# Patient Record
Sex: Male | Born: 1990 | Race: White | Hispanic: No | Marital: Single | Smoking: Never smoker
Health system: Southern US, Community
[De-identification: ages and names within clinical notes are randomized; demographics above are authoritative.]

## PROBLEM LIST (undated history)

## (undated) DIAGNOSIS — F32A Depression, unspecified: Secondary | ICD-10-CM

## (undated) DIAGNOSIS — H9311 Tinnitus, right ear: Secondary | ICD-10-CM

## (undated) DIAGNOSIS — F909 Attention-deficit hyperactivity disorder, unspecified type: Secondary | ICD-10-CM

## (undated) DIAGNOSIS — H18609 Keratoconus, unspecified, unspecified eye: Secondary | ICD-10-CM

## (undated) HISTORY — PX: HERNIA REPAIR: SHX51

## (undated) HISTORY — DX: Keratoconus, unspecified, unspecified eye: H18.609

## (undated) HISTORY — DX: Attention-deficit hyperactivity disorder, unspecified type: F90.9

## (undated) HISTORY — PX: WISDOM TOOTH EXTRACTION: SHX21

## (undated) HISTORY — DX: Depression, unspecified: F32.A

## (undated) HISTORY — DX: Tinnitus, right ear: H93.11

---
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
## 2012-11-03 IMAGING — CR DG CHEST 2V
2 series · 2 of 2 positions shown · non-contrast
Comparison: 07/17/2006

CLINICAL DATA: Cough and wheezing for 1-1/2 weeks.

CHEST - 2 VIEW

[view not recorded (1 of 2)]
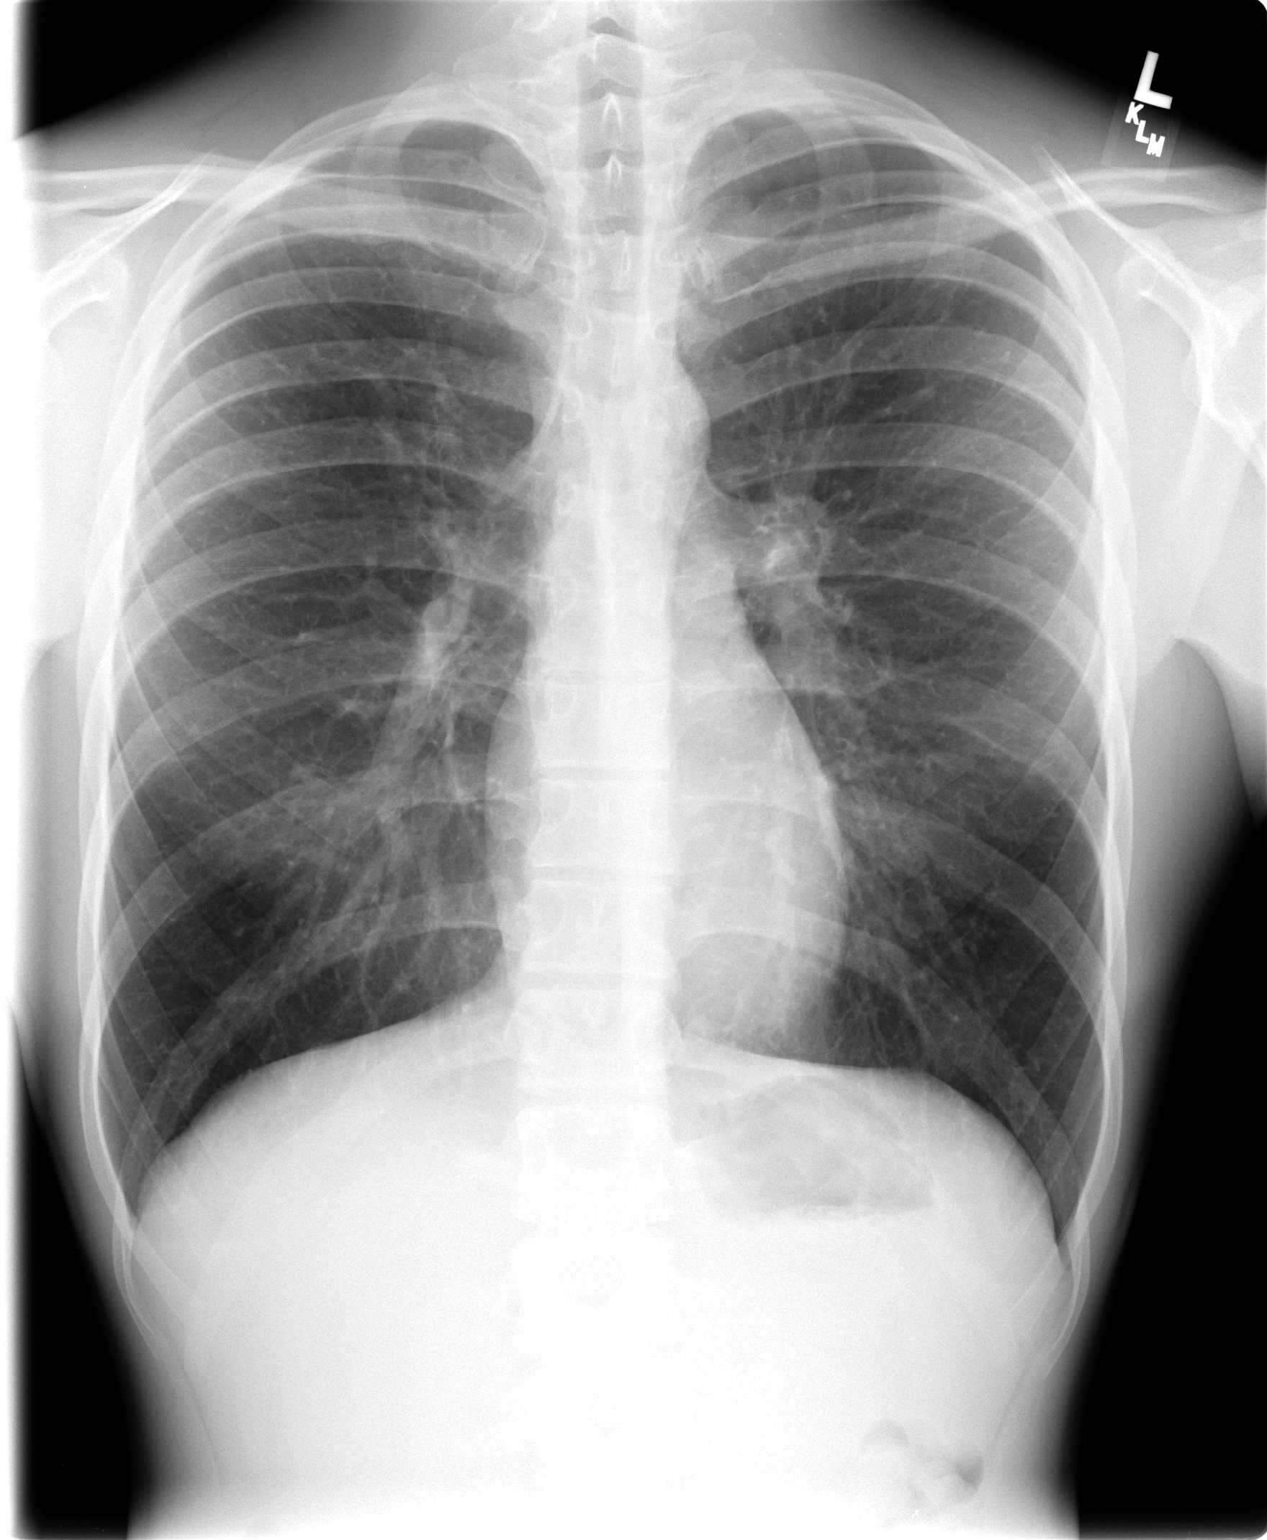

[view not recorded (2 of 2)]
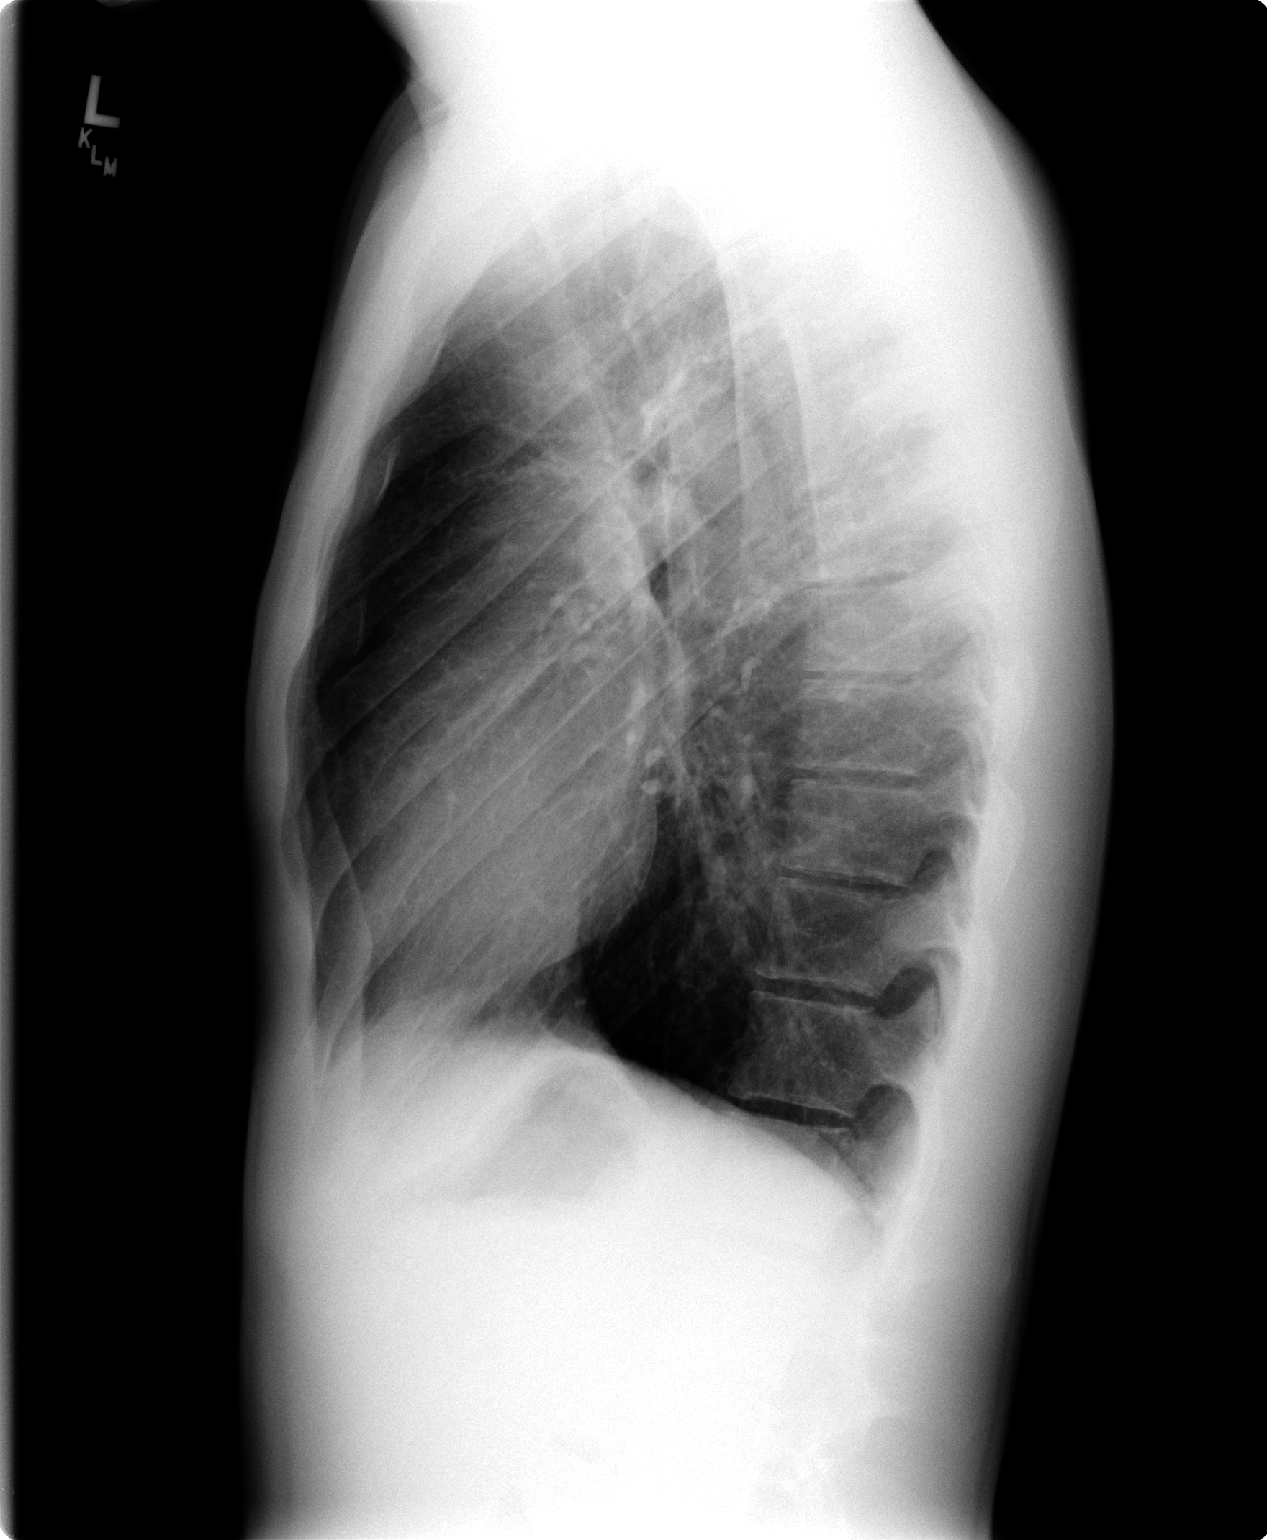

[2 of 2 positions shown; findings below may reference images not displayed]

FINDINGS: The lung fields are well expanded with mild
hyperinflation.  Taking this into consideration heart and
mediastinal contours are within normal limits.  There is mild
central peribronchial cuffing identified and this can be seen with
viral pneumonitis as well as reactive airway disease.  The lung
fields are clear with no signs of focal infiltrate or congestive
failure.  No pleural fluid or peribronchial cuffing is seen.  No
pneumothorax is noted.

Bony structures appear intact.
IMPRESSION: Mild hyperinflation with mild central peribronchial cuffing.  This
finding can be seen with reactive airway disease and viral
pneumonitis.  No findings to suggest focal bronchopneumonia.

## 2014-08-07 ENCOUNTER — Emergency Department (HOSPITAL_COMMUNITY)
Admission: EM | Admit: 2014-08-07 | Discharge: 2014-08-07 | Disposition: A | Discharge status: Home / Self Care | Payer: BC Managed Care – PPO | Attending: Emergency Medicine | Admitting: Emergency Medicine

## 2014-08-07 ENCOUNTER — Encounter (HOSPITAL_COMMUNITY): Payer: Self-pay | Admitting: Emergency Medicine

## 2014-08-07 DIAGNOSIS — R05 Cough: Secondary | ICD-10-CM | POA: Diagnosis present

## 2014-08-07 DIAGNOSIS — J069 Acute upper respiratory infection, unspecified: Secondary | ICD-10-CM | POA: Insufficient documentation

## 2014-08-07 MED ORDER — AZITHROMYCIN 250 MG PO TABS: 250.0000 mg | ORAL_TABLET | Freq: Every day | ORAL | Status: DC

## 2014-08-07 NOTE — ED Notes (Addendum)
Pt reports productive cough over past weeks, can not recall what his sputum has looked like. He is concerned because he had pneumonia in January, he is afraid he may have pneumonia again. He denies pain. States he might have had a low fever

## 2014-08-07 NOTE — Discharge Instructions (Signed)
Upper Respiratory Infection, Adult An upper respiratory infection (URI) is also sometimes known as the common cold. The upper respiratory tract includes the nose, sinuses, throat, trachea, and bronchi. Bronchi are the airways leading to the lungs. Most people improve within 1 week, but symptoms can last up to 2 weeks. A residual cough may last even longer.  CAUSES Many different viruses can infect the tissues lining the upper respiratory tract. The tissues become irritated and inflamed and often become very moist. Mucus production is also common. A cold is contagious. You can easily spread the virus to others by oral contact. This includes kissing, sharing a glass, coughing, or sneezing. Touching your mouth or nose and then touching a surface, which is then touched by another person, can also spread the virus. SYMPTOMS  Symptoms typically develop 1 to 3 days after you come in contact with a cold virus. Symptoms vary from person to person. They may include:  Runny nose.  Sneezing.  Nasal congestion.  Sinus irritation.  Sore throat.  Loss of voice (laryngitis).  Cough.  Fatigue.  Muscle aches.  Loss of appetite.  Headache.  Low-grade fever. DIAGNOSIS  You might diagnose your own cold based on familiar symptoms, since most people get a cold 2 to 3 times a year. Your caregiver can confirm this based on your exam. Most importantly, your caregiver can check that your symptoms are not due to another disease such as strep throat, sinusitis, pneumonia, asthma, or epiglottitis. Blood tests, throat tests, and X-rays are not necessary to diagnose a common cold, but they may sometimes be helpful in excluding other more serious diseases. Your caregiver will decide if any further tests are required. RISKS AND COMPLICATIONS  You may be at risk for a more severe case of the common cold if you smoke cigarettes, have chronic heart disease (such as heart failure) or lung disease (such as asthma), or if  you have a weakened immune system. The very young and very old are also at risk for more serious infections. Bacterial sinusitis, middle ear infections, and bacterial pneumonia can complicate the common cold. The common cold can worsen asthma and chronic obstructive pulmonary disease (COPD). Sometimes, these complications can require emergency medical care and may be life-threatening. PREVENTION  The best way to protect against getting a cold is to practice good hygiene. Avoid oral or hand contact with people with cold symptoms. Wash your hands often if contact occurs. There is no clear evidence that vitamin C, vitamin E, echinacea, or exercise reduces the chance of developing a cold. However, it is always recommended to get plenty of rest and practice good nutrition. TREATMENT  Treatment is directed at relieving symptoms. There is no cure. Antibiotics are not effective, because the infection is caused by a virus, not by bacteria. Treatment may include:  Increased fluid intake. Sports drinks offer valuable electrolytes, sugars, and fluids.  Breathing heated mist or steam (vaporizer or shower).  Eating chicken soup or other clear broths, and maintaining good nutrition.  Getting plenty of rest.  Using gargles or lozenges for comfort.  Controlling fevers with ibuprofen or acetaminophen as directed by your caregiver.  Increasing usage of your inhaler if you have asthma. Zinc gel and zinc lozenges, taken in the first 24 hours of the common cold, can shorten the duration and lessen the severity of symptoms. Pain medicines may help with fever, muscle aches, and throat pain. A variety of non-prescription medicines are available to treat congestion and runny nose. Your caregiver   can make recommendations and may suggest nasal or lung inhalers for other symptoms.  HOME CARE INSTRUCTIONS   Only take over-the-counter or prescription medicines for pain, discomfort, or fever as directed by your  caregiver.  Use a warm mist humidifier or inhale steam from a shower to increase air moisture. This may keep secretions moist and make it easier to breathe.  Drink enough water and fluids to keep your urine clear or pale yellow.  Rest as needed.  Return to work when your temperature has returned to normal or as your caregiver advises. You may need to stay home longer to avoid infecting others. You can also use a face mask and careful hand washing to prevent spread of the virus. SEEK MEDICAL CARE IF:   After the first few days, you feel you are getting worse rather than better.  You need your caregiver's advice about medicines to control symptoms.  You develop chills, worsening shortness of breath, or brown or red sputum. These may be signs of pneumonia.  You develop yellow or brown nasal discharge or pain in the face, especially when you bend forward. These may be signs of sinusitis.  You develop a fever, swollen neck glands, pain with swallowing, or white areas in the back of your throat. These may be signs of strep throat. SEEK IMMEDIATE MEDICAL CARE IF:   You have a fever.  You develop severe or persistent headache, ear pain, sinus pain, or chest pain.  You develop wheezing, a prolonged cough, cough up blood, or have a change in your usual mucus (if you have chronic lung disease).  You develop sore muscles or a stiff neck. Document Released: 04/16/2001 Document Revised: 01/13/2012 Document Reviewed: 01/26/2014 [employer] Patient Information 2015 [employer]. This information is not intended to replace advice given to you by your health care provider. Make sure you discuss any questions you have with your health care provider.  

## 2014-08-07 NOTE — ED Provider Notes (Signed)
CSN: [id number]     Arrival date & time 08/07/14  1554 History  This chart was scribed for non-physician practitioner, Rick [name]. Rick Seat, PA-C working with Rick Bucco, MD by Rick [name], ED scribe. This patient was seen in room TR06C/TR06C and the patient's care was started at 4:19 PM.  Chief Complaint  Patient presents with  . Cough   The history is provided by the patient. No language interpreter was used.   HPI Comments: Rick [name] is a 23 y.o. male who presents to the Emergency Department complaining of a intermittent, gradually improving cough that started 20 days ago when he had a cold. He states wheezing and a fever of 99 as associated symptoms. Pt visited doctor on campus four times since the onset of symptoms. When he visited two weeks ago the doctor prescribed a 3-day medication, an inhaler, and asked him to get a chest x-ray. The pt does not know what the medication was, but he notes that he has had some relief to symptoms. His visit today is mostly regarding the unfilled order for a chest x-ray. Pt states cough is worse at night. Pt denies CP and allergies. Pt is concerned that cough may be stimulated by mold because he lives in old building on campus.  History reviewed. No pertinent past medical history. History reviewed. No pertinent past surgical history. History reviewed. No pertinent family history. History  Substance Use Topics  . Smoking status: Never Smoker   . Smokeless tobacco: Not on file  . Alcohol Use: Yes    Review of Systems  Respiratory: Positive for cough and wheezing.   Cardiovascular: Negative for chest pain.  Allergic/Immunologic: Negative.   All other systems reviewed and are negative.  Allergies  Review of patient's allergies indicates no known allergies.  Home Medications   Prior to Admission medications   Medication Sig Start Date End Date Taking? Authorizing Provider  azithromycin (ZITHROMAX) 250 MG tablet Take 1 tablet (250 mg total)  by mouth daily. Take first 2 tablets together, then 1 every day until finished. 08/07/14   Rick Mallen Irine Seal, PA-C   BP 136/80  Pulse 96  Temp(Src) 98.4 F (36.9 C) (Oral)  Resp 14  Ht 5\' 11"  (1.803 m)  Wt 128 lb (58.06 kg)  BMI 17.86 kg/m2  SpO2 100% Physical Exam  Nursing note and vitals reviewed. Constitutional: He appears well-developed and well-nourished. No distress.  HENT:  Head: Normocephalic and atraumatic.  Eyes: Conjunctivae and EOM are normal.  Neck: Neck supple. No tracheal deviation present.  Cardiovascular: Normal rate, regular rhythm and normal heart sounds.   Pulmonary/Chest: Effort normal and breath sounds normal. No respiratory distress. He has no wheezes. He has no rales.  Skin: Skin is warm and dry.  Psychiatric: He has a normal mood and affect. His behavior is normal.    ED Course  Procedures  DIAGNOSTIC STUDIES: Oxygen Saturation is 96% on RA, adequate by my interpretation.    COORDINATION OF CARE: 4:27 PM Will not order x-ray because symptoms have improved and the patient has already finished treatment. Will order a Z-pack. Discussed treatment plan with pt at bedside and pt agreed to plan. Pt can follow-up with PCP. Pt looks well, non toxic. Feeling better has inhaler and cough medication already.  Labs Review Labs Reviewed - No data to display  Imaging Review No results found.   EKG Interpretation None      MDM   Final diagnoses:  URI (upper respiratory infection)  23 y.o.Rick [name]'s evaluation in the Emergency Department is complete. It has been determined that no acute conditions requiring further emergency intervention are present at this time. The patient/guardian have been advised of the diagnosis and plan. We have discussed signs and symptoms that warrant return to the ED, such as changes or worsening in symptoms.  Vital signs are stable at discharge. Filed Vitals:   08/07/14 1641  BP: 136/80  Pulse: 96  Temp: 98.4 F (36.9  C)  Resp: 14    Patient/guardian has voiced understanding and agreed to follow-up with the PCP or specialist.  I personally performed the services described in this documentation, which was scribed in my presence. The recorded information has been reviewed and is accurate.    Rick Matasiffany G Haillee Johann, PA-C 08/07/14 1644

## 2014-08-07 NOTE — ED Notes (Signed)
Declined W/C at D/C and was escorted to lobby by RN. 

## 2014-08-07 NOTE — ED Provider Notes (Signed)
Medical screening examination/treatment/procedure(s) were performed by non-physician practitioner and as supervising physician I was immediately available for consultation/collaboration.   EKG Interpretation None        Nashly Olsson, MD 08/07/14 2338 

## 2017-03-24 ENCOUNTER — Telehealth: Payer: Self-pay | Admitting: *Deleted

## 2017-03-24 NOTE — Telephone Encounter (Signed)
PreVisit Call attempted. Left VM. 

## 2017-03-25 ENCOUNTER — Ambulatory Visit: Payer: PPO | Admitting: Family Medicine

## 2017-03-25 DIAGNOSIS — Z0289 Encounter for other administrative examinations: Secondary | ICD-10-CM

## 2017-03-26 ENCOUNTER — Encounter: Payer: Self-pay | Admitting: Family Medicine

## 2017-03-26 ENCOUNTER — Encounter (INDEPENDENT_AMBULATORY_CARE_PROVIDER_SITE_OTHER): Payer: Self-pay

## 2017-03-26 ENCOUNTER — Ambulatory Visit (INDEPENDENT_AMBULATORY_CARE_PROVIDER_SITE_OTHER): Payer: Self-pay | Admitting: Family Medicine

## 2017-03-26 VITALS — BP 120/78 | HR 116 | Temp 98.1°F | Wt 136.6 lb

## 2017-03-26 DIAGNOSIS — F909 Attention-deficit hyperactivity disorder, unspecified type: Secondary | ICD-10-CM

## 2017-03-26 NOTE — Progress Notes (Addendum)
  HPI  Review of Systems  Constitutional: Negative for chills, fever and malaise/fatigue.  HENT: Negative for ear pain, sinus pain and sore throat.   Eyes: Negative for blurred vision and double vision.  Respiratory: Negative for cough, shortness of breath and wheezing.   Cardiovascular: Negative for chest pain, palpitations and leg swelling.  Gastrointestinal: Negative for abdominal pain, diarrhea and vomiting.  Musculoskeletal: Negative for back pain, joint pain and neck pain.  Neurological: Negative for dizziness and headaches.  Psychiatric/Behavioral: Negative for depression, hallucinations and memory loss.  Physical Exam  This is a 26 year old patient that was to establish with me today. Unfortunately, I did not know until he presented to the office that he only wanted a prescription of Vyvanse for me. He already has a psychiatrist that prescribes Ritalin. And he received that medication within the last 2 weeks. He also takes Xanax twice daily. His issue is that he will be leaving this weekend to go out of the country and wanted the prescription today. Rather than have him incur costs of the visit, I went ahead and let him and his mother noted that I'm not comfortable with this and don't feel that we would be a good fit regarding physician-patient relationship. No charge for this visit.

## 2021-05-04 DIAGNOSIS — Z0000 Encounter for general adult medical examination without abnormal findings: Secondary | ICD-10-CM | POA: Diagnosis not present

## 2021-05-05 LAB — HEPATIC FUNCTION PANEL
ALT: 24 U/L (ref 10–40)
AST: 19 (ref 14–40)
Alkaline Phosphatase: 55 (ref 25–125)
Bilirubin, Total: 1.1

## 2021-05-05 LAB — BASIC METABOLIC PANEL
BUN: 16 (ref 4–21)
CO2: 27 — AB (ref 13–22)
Chloride: 103 (ref 99–108)
Creatinine: 0.9 (ref 0.6–1.3)
Glucose: 87
Potassium: 3.9 mEq/L (ref 3.5–5.1)
Sodium: 140 (ref 137–147)

## 2021-05-05 LAB — CBC AND DIFFERENTIAL
HCT: 48 (ref 41–53)
Hemoglobin: 16.2 (ref 13.5–17.5)
Neutrophils Absolute: 3315
Platelets: 244 10*3/uL (ref 150–400)
WBC: 6.5

## 2021-05-05 LAB — LIPID PANEL
Cholesterol: 225 — AB (ref 0–200)
HDL: 39 (ref 35–70)
LDL Cholesterol: 156
LDl/HDL Ratio: 5.8
Triglycerides: 162 — AB (ref 40–160)

## 2021-05-05 LAB — VITAMIN D 25 HYDROXY (VIT D DEFICIENCY, FRACTURES): Vit D, 25-Hydroxy: 22

## 2021-05-05 LAB — CBC: RBC: 5.37 — AB (ref 3.87–5.11)

## 2021-05-05 LAB — COMPREHENSIVE METABOLIC PANEL
Albumin: 4.9 (ref 3.5–5.0)
Calcium: 10.1 (ref 8.7–10.7)
Globulin: 2.5

## 2021-05-05 LAB — TSH: TSH: 1.25 (ref 0.41–5.90)

## 2021-06-11 DIAGNOSIS — H18601 Keratoconus, unspecified, right eye: Secondary | ICD-10-CM | POA: Diagnosis not present

## 2021-09-17 DIAGNOSIS — H18601 Keratoconus, unspecified, right eye: Secondary | ICD-10-CM | POA: Diagnosis not present

## 2022-03-26 DIAGNOSIS — H18601 Keratoconus, unspecified, right eye: Secondary | ICD-10-CM | POA: Diagnosis not present

## 2022-07-19 DIAGNOSIS — H18622 Keratoconus, unstable, left eye: Secondary | ICD-10-CM | POA: Diagnosis not present

## 2022-07-29 DIAGNOSIS — H18622 Keratoconus, unstable, left eye: Secondary | ICD-10-CM | POA: Diagnosis not present

## 2022-11-05 DIAGNOSIS — H18621 Keratoconus, unstable, right eye: Secondary | ICD-10-CM | POA: Diagnosis not present

## 2023-02-05 DIAGNOSIS — H18623 Keratoconus, unstable, bilateral: Secondary | ICD-10-CM | POA: Diagnosis not present

## 2023-03-14 ENCOUNTER — Ambulatory Visit: Payer: Self-pay | Admitting: Adult Health

## 2023-03-27 ENCOUNTER — Encounter: Payer: Self-pay | Admitting: Family

## 2023-03-27 ENCOUNTER — Ambulatory Visit: Payer: BC Managed Care – PPO | Admitting: Family

## 2023-03-27 VITALS — BP 130/80 | HR 88 | Temp 97.6°F | Resp 20 | Ht 71.0 in | Wt 165.2 lb

## 2023-03-27 DIAGNOSIS — H18603 Keratoconus, unspecified, bilateral: Secondary | ICD-10-CM | POA: Diagnosis not present

## 2023-03-27 DIAGNOSIS — Z1159 Encounter for screening for other viral diseases: Secondary | ICD-10-CM

## 2023-03-27 DIAGNOSIS — Z0000 Encounter for general adult medical examination without abnormal findings: Secondary | ICD-10-CM

## 2023-03-27 DIAGNOSIS — F908 Attention-deficit hyperactivity disorder, other type: Secondary | ICD-10-CM | POA: Diagnosis not present

## 2023-03-27 DIAGNOSIS — E782 Mixed hyperlipidemia: Secondary | ICD-10-CM | POA: Diagnosis not present

## 2023-03-27 DIAGNOSIS — F331 Major depressive disorder, recurrent, moderate: Secondary | ICD-10-CM

## 2023-03-27 DIAGNOSIS — Z113 Encounter for screening for infections with a predominantly sexual mode of transmission: Secondary | ICD-10-CM

## 2023-03-27 DIAGNOSIS — E559 Vitamin D deficiency, unspecified: Secondary | ICD-10-CM

## 2023-03-27 MED ORDER — RITALIN LA 20 MG PO CP24: 20.0000 mg | ORAL_CAPSULE | ORAL | 0 refills | Status: DC

## 2023-03-27 MED ORDER — BUPROPION HCL ER (XL) 150 MG PO TB24: 150.0000 mg | ORAL_TABLET | Freq: Every day | ORAL | 0 refills | Status: DC

## 2023-03-27 NOTE — Progress Notes (Signed)
Provider: Richarda Blade FNP-C   Daron Stutz, Donalee Citrin, NP  Patient Care Team: Nyeshia Mysliwiec, Donalee Citrin, NP as PCP - General (Family Medicine) Tyrone Schimke, MD as Consulting Physician (Ophthalmology)  Extended Emergency Contact Information Primary Emergency Contact: Cashwell,[name] Address: [address]          [place], [place] [address] D[place] of [place] Phone: [phone] Relation: Mother Secondary Emergency Contact: Lasseigne,[name] Mobile Phone: [phone] Relation: Brother Preferred language: English Interpreter needed? No  Code Status:  Full Code  Goals of care: Advanced Directive information    08/07/2014    4:46 PM  Advanced Directives  Does Patient Have a Medical Advance Directive? No  Would patient like information on creating a medical advance directive? Yes - Lexicographer Complaint  Patient presents with   New Patient (Initial Visit)    Patient presents today for a new patient appointment.    HPI:  Pt is a 32 y.o. male seen today establish care here at [facility] for medical management of chronic diseases.Has a medical history of ADHD,major depression off Wellbutrin,right ear Tinnitus,Keratoconus,Hyperlipidemia,vitamin D deficiency among others.     States feels depressed.Has been off Wellbutrin for over 2 hrs. Has not seen a Psychiatrist since he was in college.states symptoms not as bad as when he was in high school.Previously on wellbutrin 450 mg daily.   ADHD - has been off medication but continues to have issues with concentration.  Right ear tinnitus - af friend had loud music in the care with affected the ear in 2018.    Past Medical History:  Diagnosis Date   ADHD (attention deficit hyperactivity disorder)    Depression    Keratoconus    Tinnitus of right ear    Past Surgical History:  Procedure Laterality Date   HERNIA REPAIR     WISDOM TOOTH EXTRACTION      No Known  Allergies  Allergies as of 03/27/2023   No Known Allergies      Medication List        Accurate as of Mar 27, 2023  3:43 PM. If you have any questions, ask your nurse or doctor.          STOP taking these medications    azithromycin 250 MG tablet Commonly known as: ZITHROMAX Stopped by: Caesar Bookman, NP   PROBIOTIC-10 PO Stopped by: Caesar Bookman, NP       TAKE these medications    Forfivo XL 450 MG Tb24 Generic drug: buPROPion HCl ER (XL)   methylphenidate 10 MG 24 hr capsule Commonly known as: RITALIN LA Take by mouth. What changed: Another medication with the same name was removed. Continue taking this medication, and follow the directions you see here. Changed by: Caesar Bookman, NP   Ritalin LA 20 MG 24 hr capsule Generic drug: methylphenidate What changed: Another medication with the same name was removed. Continue taking this medication, and follow the directions you see here. Changed by: Caesar Bookman, NP   Xanax 0.5 MG tablet Generic drug: ALPRAZolam        Review of Systems  Constitutional:  Negative for appetite change, chills, fatigue, fever and unexpected weight change.  HENT:  Positive for tinnitus. Negative for congestion, dental problem, ear discharge, ear pain, facial swelling, hearing loss, nosebleeds, postnasal drip, rhinorrhea, sinus pressure, sinus pain, sneezing, sore throat and trouble swallowing.        Right ear tinnitus  Eyes:  Positive for visual disturbance. Negative for pain, discharge, redness and itching.       Follows up with ophthalmology   Respiratory:  Negative for cough, chest tightness, shortness of breath and wheezing.   Cardiovascular:  Negative for chest pain, palpitations and leg swelling.  Gastrointestinal:  Negative for abdominal distention, abdominal pain, blood in stool, constipation, diarrhea, nausea and vomiting.  Endocrine: Negative for cold intolerance, heat intolerance, polydipsia, polyphagia and  polyuria.  Genitourinary:  Negative for difficulty urinating, dysuria, flank pain, frequency and urgency.  Musculoskeletal:  Negative for arthralgias, back pain, gait problem, joint swelling, myalgias, neck pain and neck stiffness.  Skin:  Negative for color change, pallor, rash and wound.  Neurological:  Negative for dizziness, syncope, speech difficulty, weakness, light-headedness, numbness and headaches.  Hematological:  Does not bruise/bleed easily.  Psychiatric/Behavioral:  Positive for decreased concentration. Negative for agitation, behavioral problems, confusion, hallucinations, self-injury, sleep disturbance and suicidal ideas. The patient is not nervous/anxious.        Depression     Immunization History  Administered Date(s) Administered   DTaP 10/11/1993, 12/05/1993, 02/14/1994, 09/26/1995, 06/29/1998   HIB (PRP-OMP) 10/11/1993, 12/05/1993, 02/14/1994, 12/01/1994   Hepatitis A 08/13/2005, 03/21/2006   Hpv-Unspecified 12/12/2009, 02/12/2010, 06/19/2010   MMR 12/01/1994, 08/09/1997   Meningococcal Mcv4o 08/13/2005, 02/08/2014   OPV 10/11/1993, 12/05/1993, 02/14/1994, 06/29/1998   Td 02/08/2014   Tdap 08/13/2005, 05/03/2021   Varicella 12/01/1994, 09/26/2007   Pertinent  Health Maintenance Due  Topic Date Due   INFLUENZA VACCINE  06/05/2023       No data to display         Functional Status Survey:    Vitals:   03/27/23 1520  BP: 130/80  Pulse: 88  Resp: 20  Temp: 97.6 F (36.4 C)  SpO2: 97%  Weight: 165 lb 3.2 oz (74.9 kg)  Height: 5\' 11"  (1.803 m)   Body mass index is 23.04 kg/m. Physical Exam Vitals reviewed.  Constitutional:      General: He is not in acute distress.    Appearance: Normal appearance. He is normal weight. He is not ill-appearing or diaphoretic.  HENT:     Head: Normocephalic.     Right Ear: Tympanic membrane, ear canal and external ear normal. There is no impacted cerumen.     Left Ear: Tympanic membrane, ear canal and external ear  normal. There is no impacted cerumen.     Nose: Nose normal. No congestion or rhinorrhea.     Mouth/Throat:     Mouth: Mucous membranes are moist.     Pharynx: Oropharynx is clear. No oropharyngeal exudate or posterior oropharyngeal erythema.  Eyes:     General: No scleral icterus.       Right eye: No discharge.        Left eye: No discharge.     Extraocular Movements: Extraocular movements intact.     Conjunctiva/sclera: Conjunctivae normal.     Pupils: Pupils are equal, round, and reactive to light.  Neck:     Vascular: No carotid bruit.  Cardiovascular:     Rate and Rhythm: Normal rate and regular rhythm.     Pulses: Normal pulses.     Heart sounds: Normal heart sounds. No murmur heard.    No friction rub. No gallop.  Pulmonary:     Effort: Pulmonary effort is normal. No respiratory distress.     Breath sounds: Normal breath sounds. No wheezing, rhonchi or rales.  Chest:     Chest wall: No  tenderness.  Abdominal:     General: Bowel sounds are normal. There is no distension.     Palpations: Abdomen is soft. There is no mass.     Tenderness: There is no abdominal tenderness. There is no right CVA tenderness, left CVA tenderness, guarding or rebound.  Musculoskeletal:        General: No swelling or tenderness. Normal range of motion.     Cervical back: Normal range of motion. No rigidity or tenderness.     Right lower leg: No edema.     Left lower leg: No edema.  Lymphadenopathy:     Cervical: No cervical adenopathy.  Skin:    General: Skin is warm and dry.     Coloration: Skin is not pale.     Findings: No bruising, erythema, lesion or rash.  Neurological:     Mental Status: He is alert and oriented to person, place, and time.     Cranial Nerves: No cranial nerve deficit.     Sensory: No sensory deficit.     Motor: No weakness.     Coordination: Coordination normal.     Gait: Gait normal.  Psychiatric:        Mood and Affect: Mood normal.        Speech: Speech normal.         Behavior: Behavior normal.        Thought Content: Thought content normal.        Judgment: Judgment normal.    Labs reviewed: No results for input(s): "NA", "K", "CL", "CO2", "GLUCOSE", "BUN", "CREATININE", "CALCIUM", "MG", "PHOS" in the last 8760 hours. No results for input(s): "AST", "ALT", "ALKPHOS", "BILITOT", "PROT", "ALBUMIN" in the last 8760 hours. No results for input(s): "WBC", "NEUTROABS", "HGB", "HCT", "MCV", "PLT" in the last 8760 hours. No results found for: "TSH" No results found for: "HGBA1C" No results found for: "CHOL", "HDL", "LDLCALC", "LDLDIRECT", "TRIG", "CHOLHDL"  Significant Diagnostic Results in last 30 days:  No results found.  Assessment/Plan 1. Mixed hyperlipidemia - no latest LDL for review  - dietary modification and exercise advised  - Lipid panel; Future  2. Attention deficit hyperactivity disorder (ADHD), other type Continue on Ritalin - non-narcotic use contract reviewed and signed by patient during visit - PDMP reviewed - RITALIN LA 20 MG 24 hr capsule; Take 1 capsule (20 mg total) by mouth every morning.  Dispense: 30 capsule; Refill: 0 - buPROPion (WELLBUTRIN XL) 150 MG 24 hr tablet; Take 1 tablet (150 mg total) by mouth daily.  Dispense: 30 tablet; Refill: 0 - COMPLETE METABOLIC PANEL WITH GFR; Future - CBC with Differential/Platelet; Future  3. Moderate episode of recurrent major depressive disorder (HCC) Symptoms have worsen would like to restart on Bupropion.will start on 150 mg then follow up in one month then increase dose if still having depression symptoms.  - buPROPion (WELLBUTRIN XL) 150 MG 24 hr tablet; Take 1 tablet (150 mg total) by mouth daily.  Dispense: 30 tablet; Refill: 0 - TSH; Future - COMPLETE METABOLIC PANEL WITH GFR; Future - CBC with Differential/Platelet; Future  4. Keratoconus of both eyes Continue to follow up with Ophthalmologist   5. Encounter for medical examination to establish care Immunization  reviewed up-to-date except has never gotten COVID 19 vaccine.Recommend fasting blood work and schedule routine visits  6. Screen for STD (sexually transmitted disease) Reports no high risk behaviors - HIV Antibody (routine testing w rflx); Future  7. Encounter for hepatitis C screening test for low risk patient  Low risk - Hepatitis C antibody; Future  8. Vitamin D deficiency Reports previous low vitamin D level was on supplements.Will recheck levels in the interest that supplements if indicated - Vitamin D, 1,25-dihydroxy; Future  Family/ staff Communication: Reviewed plan of care with patient  Labs/tests ordered: Future labs :  - Vitamin D, 1,25-dihydroxy - CBC with Differential/Platelet - CMP with eGFR([facility]) - TSH - Lipid panel - Hep C Antibody - HIV Antibody (routine testing w rflx)  Next Appointment : Return in about 6 months (around 09/27/2023) for medical mangement of chronic issues., fasting labs in one week.   Caesar Bookman, NP

## 2023-03-28 ENCOUNTER — Other Ambulatory Visit: Payer: BC Managed Care – PPO

## 2023-03-28 DIAGNOSIS — E782 Mixed hyperlipidemia: Secondary | ICD-10-CM

## 2023-03-28 DIAGNOSIS — Z113 Encounter for screening for infections with a predominantly sexual mode of transmission: Secondary | ICD-10-CM

## 2023-03-28 DIAGNOSIS — F908 Attention-deficit hyperactivity disorder, other type: Secondary | ICD-10-CM

## 2023-03-28 DIAGNOSIS — Z1159 Encounter for screening for other viral diseases: Secondary | ICD-10-CM | POA: Diagnosis not present

## 2023-03-28 DIAGNOSIS — E559 Vitamin D deficiency, unspecified: Secondary | ICD-10-CM | POA: Diagnosis not present

## 2023-03-28 DIAGNOSIS — F331 Major depressive disorder, recurrent, moderate: Secondary | ICD-10-CM

## 2023-03-28 LAB — CBC WITH DIFFERENTIAL/PLATELET
Absolute Monocytes: 545 cells/uL (ref 200–950)
Basophils Relative: 0.6 %
Eosinophils Relative: 1.9 %
Hemoglobin: 15.6 g/dL (ref 13.2–17.1)
MCHC: 33.8 g/dL (ref 32.0–36.0)
Monocytes Relative: 7.9 %
RDW: 13.1 % (ref 11.0–15.0)

## 2023-03-29 LAB — LIPID PANEL
Total CHOL/HDL Ratio: 5.8 (calc) — ABNORMAL HIGH (ref <5.0)
Triglycerides: 277 mg/dL — ABNORMAL HIGH (ref <150)

## 2023-03-29 LAB — COMPLETE METABOLIC PANEL WITH GFR
ALT: 27 U/L (ref 9–46)
Albumin: 4.9 g/dL (ref 3.6–5.1)
CO2: 28 mmol/L (ref 20–32)
Chloride: 103 mmol/L (ref 98–110)
Creat: 0.96 mg/dL (ref 0.60–1.24)
Globulin: 2.5 g/dL (calc) (ref 1.9–3.7)
Sodium: 141 mmol/L (ref 135–146)
Total Bilirubin: 0.7 mg/dL (ref 0.2–1.2)

## 2023-03-29 LAB — CBC WITH DIFFERENTIAL/PLATELET
Basophils Absolute: 41 cells/uL (ref 0–200)
HCT: 46.2 % (ref 38.5–50.0)
MCH: 29.7 pg (ref 27.0–33.0)

## 2023-03-29 LAB — HEPATITIS C ANTIBODY: Hepatitis C Ab: NONREACTIVE

## 2023-04-01 ENCOUNTER — Telehealth: Payer: Self-pay

## 2023-04-01 LAB — CBC WITH DIFFERENTIAL/PLATELET
Eosinophils Absolute: 131 cells/uL (ref 15–500)
Lymphs Abs: 3195 cells/uL (ref 850–3900)
MCV: 88 fL (ref 80.0–100.0)
MPV: 9.4 fL (ref 7.5–12.5)
Neutro Abs: 2988 cells/uL (ref 1500–7800)
Neutrophils Relative %: 43.3 %
Platelets: 269 10*3/uL (ref 140–400)
RBC: 5.25 10*6/uL (ref 4.20–5.80)
Total Lymphocyte: 46.3 %
WBC: 6.9 10*3/uL (ref 3.8–10.8)

## 2023-04-01 LAB — COMPLETE METABOLIC PANEL WITH GFR
AG Ratio: 2 (calc) (ref 1.0–2.5)
AST: 18 U/L (ref 10–40)
Alkaline phosphatase (APISO): 53 U/L (ref 36–130)
BUN: 23 mg/dL (ref 7–25)
Calcium: 9.9 mg/dL (ref 8.6–10.3)
Glucose, Bld: 94 mg/dL (ref 65–99)
Potassium: 4.3 mmol/L (ref 3.5–5.3)
Total Protein: 7.4 g/dL (ref 6.1–8.1)
eGFR: 110 mL/min/{1.73_m2} (ref >=60)

## 2023-04-01 LAB — LIPID PANEL
Cholesterol: 197 mg/dL (ref <200)
HDL: 34 mg/dL — ABNORMAL LOW (ref >=40)
LDL Cholesterol (Calc): 120 mg/dL (calc) — ABNORMAL HIGH
Non-HDL Cholesterol (Calc): 163 mg/dL (calc) — ABNORMAL HIGH (ref <130)

## 2023-04-01 LAB — HIV ANTIBODY (ROUTINE TESTING W REFLEX): HIV 1&2 Ab, 4th Generation: NONREACTIVE

## 2023-04-01 LAB — VITAMIN D 1,25 DIHYDROXY
Vitamin D 1, 25 (OH)2 Total: 27 pg/mL (ref 18–72)
Vitamin D2 1, 25 (OH)2: <8 pg/mL
Vitamin D3 1, 25 (OH)2: 27 pg/mL

## 2023-04-01 LAB — TSH: TSH: 0.84 mIU/L (ref 0.40–4.50)

## 2023-04-01 NOTE — Telephone Encounter (Signed)
Prior authorization request received from [facility] for Ritalin LA 20mg  brand name.   PA initiated through covermymeds and waiting on reply

## 2023-04-03 NOTE — Telephone Encounter (Signed)
Prior authorization was denied. Denial letter is under media tab.  Message sent to Richarda Blade, NP

## 2023-04-03 NOTE — Telephone Encounter (Signed)
Please notify patient Ritalin brand name denied.may switch to Generic.

## 2023-04-04 NOTE — Telephone Encounter (Signed)
Called and left message for patient to call the office concerning Ritalin prior authorization

## 2023-04-08 ENCOUNTER — Other Ambulatory Visit: Payer: Self-pay | Admitting: Family

## 2023-04-08 DIAGNOSIS — F908 Attention-deficit hyperactivity disorder, other type: Secondary | ICD-10-CM

## 2023-04-08 MED ORDER — METHYLPHENIDATE HCL ER (LA) 20 MG PO CP24
20.0000 mg | ORAL_CAPSULE | ORAL | 0 refills | Status: DC
Start: 2023-04-08 — End: 2023-06-06

## 2023-04-08 NOTE — Telephone Encounter (Signed)
Will resend Ritalin LA 20 mg 24 hr capsule.

## 2023-04-08 NOTE — Telephone Encounter (Signed)
Spoke with patient and he states that he would be fine with switching to generic.   Message sent to Richarda Blade, NP

## 2023-04-22 ENCOUNTER — Encounter: Payer: Self-pay | Admitting: Family

## 2023-04-22 ENCOUNTER — Ambulatory Visit: Payer: BC Managed Care – PPO | Admitting: Family

## 2023-04-22 VITALS — BP 120/80 | HR 90 | Temp 97.7°F | Resp 18 | Ht 71.5 in | Wt 163.0 lb

## 2023-04-22 DIAGNOSIS — F331 Major depressive disorder, recurrent, moderate: Secondary | ICD-10-CM

## 2023-04-22 DIAGNOSIS — K148 Other diseases of tongue: Secondary | ICD-10-CM | POA: Diagnosis not present

## 2023-04-22 NOTE — Progress Notes (Unsigned)
Provider: Richarda Blade FNP-C  Kaevion Sinclair, Donalee Citrin, NP  Patient Care Team: Kyleen Villatoro, Donalee Citrin, NP as PCP - General (Family Medicine) Tyrone Schimke, MD as Consulting Physician (Ophthalmology)  Extended Emergency Contact Information Primary Emergency Contact: Follette,[name] Address: [address]          [place], [place] [address] [place] of [place] Phone: [phone] Relation: Mother Secondary Emergency Contact: Schepp,[name] Mobile Phone: [phone] Relation: Brother Preferred language: English Interpreter needed? No  Code Status:  Full Code  Goals of care: Advanced Directive information    08/07/2014    4:46 PM  Advanced Directives  Does Patient Have a Medical Advance Directive? No  Would patient like information on creating a medical advance directive? Yes Market researcher Complaint  Patient presents with   Medical Management of Chronic Issues    Patient is here for a 1 month F/U just picked up new med and just got over covid recentley    HPI:  Pt is a 32 y.o. male seen today for an acute visit for follow up major depression. He was seen here 03/27/2023 for major depression.He was started on Bupropion 150 mg tablet daily.States he did not start on Bupropion until one week ago.Had COVID-19 infection 2 weeks ago which has resolved.  Also complains of swelling on the tongue x 1  year.states was seen by dentist was advised to follow up with PCP    Past Medical History:  Diagnosis Date   ADHD (attention deficit hyperactivity disorder)    Depression    Keratoconus    Tinnitus of right ear    Past Surgical History:  Procedure Laterality Date   HERNIA REPAIR     WISDOM TOOTH EXTRACTION      No Known Allergies  Outpatient Encounter Medications as of 04/22/2023  Medication Sig   buPROPion (WELLBUTRIN XL) 150 MG 24 hr tablet Take 1 tablet (150 mg total) by mouth daily.   methylphenidate (RITALIN LA) 20 MG 24 hr capsule Take 1  capsule (20 mg total) by mouth every morning.   No facility-administered encounter medications on file as of 04/22/2023.    Review of Systems  Constitutional:  Negative for appetite change, chills, fatigue, fever and unexpected weight change.  HENT:  Negative for congestion, dental problem, ear discharge, ear pain, facial swelling, hearing loss, nosebleeds, postnasal drip, rhinorrhea, sinus pressure, sinus pain, sneezing, sore throat, tinnitus and trouble swallowing.   Eyes:  Negative for pain, discharge, redness, itching and visual disturbance.  Respiratory:  Negative for cough, chest tightness, shortness of breath and wheezing.   Cardiovascular:  Negative for chest pain, palpitations and leg swelling.  Gastrointestinal:  Negative for abdominal distention, abdominal pain, blood in stool, constipation, diarrhea, nausea and vomiting.  Endocrine: Negative for cold intolerance, heat intolerance, polydipsia, polyphagia and polyuria.  Genitourinary:  Negative for difficulty urinating, dysuria, flank pain, frequency and urgency.  Musculoskeletal:  Negative for arthralgias, back pain, gait problem, joint swelling, myalgias, neck pain and neck stiffness.  Skin:  Negative for color change, pallor, rash and wound.  Neurological:  Negative for dizziness, syncope, speech difficulty, weakness, light-headedness, numbness and headaches.  Hematological:  Does not bruise/bleed easily.  Psychiatric/Behavioral:  Negative for agitation, behavioral problems, confusion, hallucinations, self-injury, sleep disturbance and suicidal ideas. The patient is not nervous/anxious.        Depression and ADHD    Immunization History  Administered Date(s) Administered   DTaP 10/11/1993, 12/05/1993, 02/14/1994, 09/26/1995, 06/29/1998  HIB (PRP-OMP) 10/11/1993, 12/05/1993, 02/14/1994, 12/01/1994   Hepatitis A 08/13/2005, 03/21/2006   Hepatitis B 11/07/1993, 03/17/1994   Hpv-Unspecified 12/12/2009, 02/12/2010, 06/19/2010    MMR 12/01/1994, 08/09/1997   Meningococcal Mcv4o 08/13/2005, 02/08/2014   OPV 10/11/1993, 12/05/1993, 02/14/1994, 06/29/1998   Pneumococcal Polysaccharide-23 09/21/1997   Td 02/08/2014   Tdap 08/13/2005, 05/03/2021   Varicella 12/01/1994, 09/26/2007   Pertinent  Health Maintenance Due  Topic Date Due   INFLUENZA VACCINE  06/05/2023       No data to display         Functional Status Survey:    Vitals:   04/22/23 1522  BP: 120/80  Pulse: 90  Resp: 18  Temp: 97.7 F (36.5 C)  SpO2: 94%  Weight: 163 lb (73.9 kg)  Height: 5' 11.5" (1.816 m)   Body mass index is 22.42 kg/m. Physical Exam Vitals reviewed.  Constitutional:      General: He is not in acute distress.    Appearance: Normal appearance. He is normal weight. He is not ill-appearing or diaphoretic.  HENT:     Head: Normocephalic.     Mouth/Throat:     Mouth: Mucous membranes are moist.     Tongue: Lesions present. Tongue does not deviate from midline.     Pharynx: Oropharynx is clear. No oropharyngeal exudate or posterior oropharyngeal erythema.     Comments: Small raised lesion on left lateral tongue nontender to touch and without any erythema edema or drainage. Eyes:     General: No scleral icterus.       Right eye: No discharge.        Left eye: No discharge.     Extraocular Movements: Extraocular movements intact.     Conjunctiva/sclera: Conjunctivae normal.     Pupils: Pupils are equal, round, and reactive to light.  Cardiovascular:     Rate and Rhythm: Normal rate and regular rhythm.     Pulses: Normal pulses.     Heart sounds: Normal heart sounds. No murmur heard.    No friction rub. No gallop.  Pulmonary:     Effort: Pulmonary effort is normal. No respiratory distress.     Breath sounds: Normal breath sounds. No wheezing, rhonchi or rales.  Chest:     Chest wall: No tenderness.  Abdominal:     General: Bowel sounds are normal. There is no distension.     Palpations: Abdomen is soft. There is  no mass.     Tenderness: There is no abdominal tenderness. There is no right CVA tenderness, left CVA tenderness, guarding or rebound.  Musculoskeletal:        General: No swelling or tenderness. Normal range of motion.     Right lower leg: No edema.     Left lower leg: No edema.  Skin:    General: Skin is warm and dry.     Coloration: Skin is not pale.     Findings: No bruising, erythema, lesion or rash.  Neurological:     Mental Status: He is alert and oriented to person, place, and time.     Cranial Nerves: No cranial nerve deficit.     Sensory: No sensory deficit.     Motor: No weakness.     Coordination: Coordination normal.     Gait: Gait normal.  Psychiatric:        Mood and Affect: Mood is depressed.        Speech: Speech normal.        Behavior: Behavior normal.  Thought Content: Thought content normal.        Judgment: Judgment normal.     Labs reviewed: Recent Labs    03/28/23 0847  NA 141  K 4.3  CL 103  CO2 28  GLUCOSE 94  BUN 23  CREATININE 0.96  CALCIUM 9.9   Recent Labs    03/28/23 0847  AST 18  ALT 27  BILITOT 0.7  PROT 7.4   Recent Labs    03/28/23 0847  WBC 6.9  NEUTROABS 2,988  HGB 15.6  HCT 46.2  MCV 88.0  PLT 269   Lab Results  Component Value Date   TSH 0.84 03/28/2023   No results found for: "HGBA1C" Lab Results  Component Value Date   CHOL 197 03/28/2023   HDL 34 (L) 03/28/2023   LDLCALC 120 (H) 03/28/2023   TRIG 277 (H) 03/28/2023   CHOLHDL 5.8 (H) 03/28/2023    Significant Diagnostic Results in last 30 days:  No results found.  Assessment/Plan 1. Moderate episode of recurrent major depressive disorder (HCC) -Bupropion prescribed 1 month ago but did not start until 1 week ago due to COVID infection that he had.  Has not seen a difference in his symptoms.  Will continue with current dose then will notify provider after 1 month if symptoms have not improved.   2. Lesion of tongue Small raised lesion on left  lateral tongue nontender to touch and without any erythema edema or drainage. - Ambulatory referral to ENT  Family/ staff Communication: Reviewed plan of care with patient verbalized understanding  Labs/tests ordered: None   Next Appointment: Return if symptoms worsen or fail to improve.   Caesar Bookman, NP

## 2023-04-22 NOTE — Patient Instructions (Signed)
-   notify provider if symptoms worsen or fail to improve  

## 2023-04-23 DIAGNOSIS — F331 Major depressive disorder, recurrent, moderate: Secondary | ICD-10-CM | POA: Insufficient documentation

## 2023-05-12 DIAGNOSIS — H18621 Keratoconus, unstable, right eye: Secondary | ICD-10-CM | POA: Diagnosis not present

## 2023-06-03 ENCOUNTER — Other Ambulatory Visit: Payer: Self-pay | Admitting: Family

## 2023-06-03 ENCOUNTER — Telehealth: Payer: Self-pay

## 2023-06-03 DIAGNOSIS — F908 Attention-deficit hyperactivity disorder, other type: Secondary | ICD-10-CM

## 2023-06-03 DIAGNOSIS — F331 Major depressive disorder, recurrent, moderate: Secondary | ICD-10-CM

## 2023-06-03 MED ORDER — BUPROPION HCL ER (XL) 300 MG PO TB24
300.0000 mg | ORAL_TABLET | Freq: Every day | ORAL | 3 refills | Status: DC
Start: 2023-06-03 — End: 2023-08-11

## 2023-06-03 NOTE — Telephone Encounter (Signed)
Wellbutrin dose increased to 300 mg Tablet

## 2023-06-03 NOTE — Telephone Encounter (Signed)
Message left on clinical intake voicemail:   Needs a refill on Wellbutrin, however is questioning if dose can be increased due to ritalin making him feel down at the end of the day

## 2023-06-03 NOTE — Telephone Encounter (Signed)
Patient is aware of medication change 

## 2023-06-06 ENCOUNTER — Other Ambulatory Visit: Payer: Self-pay

## 2023-06-06 ENCOUNTER — Other Ambulatory Visit: Payer: Self-pay | Admitting: Family

## 2023-06-06 DIAGNOSIS — F908 Attention-deficit hyperactivity disorder, other type: Secondary | ICD-10-CM

## 2023-06-06 MED ORDER — METHYLPHENIDATE HCL ER (LA) 20 MG PO CP24: 20.0000 mg | ORAL_CAPSULE | ORAL | 0 refills | Status: DC

## 2023-06-06 NOTE — Telephone Encounter (Signed)
Prescription sent

## 2023-06-30 ENCOUNTER — Telehealth (INDEPENDENT_AMBULATORY_CARE_PROVIDER_SITE_OTHER): Payer: BC Managed Care – PPO | Admitting: Family

## 2023-06-30 ENCOUNTER — Encounter: Payer: Self-pay | Admitting: Family

## 2023-06-30 DIAGNOSIS — U071 COVID-19: Secondary | ICD-10-CM | POA: Diagnosis not present

## 2023-06-30 DIAGNOSIS — J069 Acute upper respiratory infection, unspecified: Secondary | ICD-10-CM | POA: Diagnosis not present

## 2023-06-30 MED ORDER — AZITHROMYCIN 250 MG PO TABS
ORAL_TABLET | ORAL | 0 refills | Status: AC
Start: 2023-06-30 — End: 2023-07-05

## 2023-06-30 MED ORDER — VITAMIN D3 50 MCG (2000 UT) PO CAPS
2000.0000 [IU] | ORAL_CAPSULE | Freq: Every day | ORAL | 0 refills | Status: AC
Start: 2023-06-30 — End: 2023-07-14

## 2023-06-30 MED ORDER — VITAMIN C 250 MG PO TABS: 250.0000 mg | ORAL_TABLET | Freq: Two times a day (BID) | ORAL | 0 refills | Status: AC

## 2023-06-30 MED ORDER — GUAIFENESIN ER 600 MG PO TB12
600.0000 mg | ORAL_TABLET | Freq: Two times a day (BID) | ORAL | 0 refills | Status: AC
Start: 2023-06-30 — End: 2023-07-14

## 2023-06-30 MED ORDER — NIRMATRELVIR/RITONAVIR (PAXLOVID)TABLET: 3.0000 | ORAL_TABLET | Freq: Two times a day (BID) | ORAL | 0 refills | Status: AC

## 2023-06-30 MED ORDER — ZINC GLUCONATE 50 MG PO TABS: 50.0000 mg | ORAL_TABLET | Freq: Every day | ORAL | 0 refills | Status: AC

## 2023-06-30 NOTE — Progress Notes (Signed)
I connected with  Rick [name] on 06/30/23 by a video enabled telemedicine application and verified that I am speaking with the correct person using two identifiers.   I discussed the limitations of evaluation and management by telemedicine. The patient expressed understanding and agreed to proceed.

## 2023-06-30 NOTE — Progress Notes (Signed)
This service is provided via telemedicine  No vital signs collected/recorded due to the encounter was a telemedicine visit.   Location of patient (ex: home, work):  Home   Patient consents to a telephone visit:  Yes, 06/30/23  Location of the provider (ex: office, home):  [facility]  Name of any referring provider: Terissa [name], Rick [name], Rick [name]   Names of all persons participating in the telemedicine service and their role in the encounter: Rick [name], Rick [name], Rick [name]  and patient  Time spent on call:  11 minutes     Provider: Tiandre Teall FNP-[name]  Rick [name], Rick [name], Rick [name]  Patient Care Team: Rick [name], Rick [name], Rick [name] (Family Medicine) Rick Schimke, MD as Consulting Physician (Ophthalmology)  Extended Emergency Contact Information Primary Emergency Contact: Rick [name] Address: [address]          [place], [place] [address] D[place] of [place] Phone: [phone] Relation: Mother Secondary Emergency Contact: Rick [name] Mobile Phone: [phone] Relation: Brother Preferred language: English Interpreter needed? No  Code Status:Full Code  Goals of care: Advanced Directive information    08/07/2014    4:46 PM  Advanced Directives  Does Patient Have a Medical Advance Directive? No  Would patient like information on creating a medical advance directive? Yes Market researcher Complaint  Patient presents with   Acute Visit    COVID positive sick for about 7-8     HPI:  Pt is a 32 y.o. male seen today for an acute visit for evaluation of COVID-19 positive yesterday.Had some symptoms 7-8 days ago then resolved.started having symptoms Friday 06/27/2023  had sore throat on Saturday. Had body aches,runny nose,headache,sneezing and cough last night.He feels warmer than yesterday. He denies any fatigue,chest tightness,chest pain,palpitation or shortness of breath. Has not been  hungry.     Past Medical History:  Diagnosis Date   ADHD (attention deficit hyperactivity disorder)    Depression    Keratoconus    Tinnitus of right ear    Past Surgical History:  Procedure Laterality Date   HERNIA REPAIR     WISDOM TOOTH EXTRACTION      No Known Allergies  Outpatient Encounter Medications as of 06/30/2023  Medication Sig   buPROPion (WELLBUTRIN XL) 300 MG 24 hr tablet Take 1 tablet (300 mg total) by mouth daily.   methylphenidate (RITALIN LA) 20 MG 24 hr capsule Take 1 capsule (20 mg total) by mouth every morning.   No facility-administered encounter medications on file as of 06/30/2023.    Review of Systems  Constitutional:  Positive for appetite change and fatigue. Negative for chills, fever and unexpected weight change.  HENT:  Positive for congestion, rhinorrhea, sneezing and sore throat. Negative for dental problem, ear discharge, ear pain, facial swelling, hearing loss, nosebleeds, postnasal drip, sinus pressure, sinus pain, tinnitus and trouble swallowing.   Eyes:  Negative for pain, discharge, redness, itching and visual disturbance.  Respiratory:  Positive for cough. Negative for chest tightness, shortness of breath and wheezing.   Cardiovascular:  Negative for chest pain, palpitations and leg swelling.  Gastrointestinal:  Negative for abdominal distention, abdominal pain, diarrhea, nausea and vomiting.  Musculoskeletal:  Positive for myalgias. Negative for arthralgias, back pain, gait problem and joint swelling.  Skin:  Negative for color change, pallor and rash.  Neurological:  Positive for headaches. Negative for dizziness, weakness and light-headedness.    Immunization History  Administered Date(s) Administered  DTaP 10/11/1993, 12/05/1993, 02/14/1994, 09/26/1995, 06/29/1998   HIB (PRP-OMP) 10/11/1993, 12/05/1993, 02/14/1994, 12/01/1994   Hepatitis A 08/13/2005, 03/21/2006   Hepatitis B 11/07/1993, 03/17/1994   Hpv-Unspecified 12/12/2009,  02/12/2010, 06/19/2010   MMR 12/01/1994, 08/09/1997   Meningococcal Mcv4o 08/13/2005, 02/08/2014   OPV 10/11/1993, 12/05/1993, 02/14/1994, 06/29/1998   Pneumococcal Polysaccharide-23 09/21/1997   Td 02/08/2014   Tdap 08/13/2005, 05/03/2021   Varicella 12/01/1994, 09/26/2007   Pertinent  Health Maintenance Due  Topic Date Due   INFLUENZA VACCINE  06/05/2023      06/30/2023    2:32 PM  Fall Risk  Falls in the past year? 0  Was there an injury with Fall? 0  Fall Risk Category Calculator 0  Patient at Risk for Falls Due to No Fall Risks  Fall risk Follow up Falls evaluation completed   Functional Status Survey:    There were no vitals filed for this visit. There is no height or weight on file to calculate BMI. Physical Exam Constitutional:      [name]: He is not in acute distress.    Appearance: He is not ill-appearing.  Pulmonary:     Effort: Pulmonary effort is normal. No respiratory distress.  Neurological:     Mental Status: He is alert and oriented to person, place, and time.     Gait: Gait normal.  Psychiatric:        Mood and Affect: Mood normal.        Behavior: Behavior normal.     Labs reviewed: Recent Labs    03/28/23 0847  NA 141  K 4.3  CL 103  CO2 28  GLUCOSE 94  BUN 23  CREATININE 0.96  CALCIUM 9.9   Recent Labs    03/28/23 0847  AST 18  ALT 27  BILITOT 0.7  PROT 7.4   Recent Labs    03/28/23 0847  WBC 6.9  NEUTROABS 2,988  HGB 15.6  HCT 46.2  MCV 88.0  PLT 269   Lab Results  Component Value Date   TSH 0.84 03/28/2023   No results found for: "HGBA1C" Lab Results  Component Value Date   CHOL 197 03/28/2023   HDL 34 (L) 03/28/2023   LDLCALC 120 (H) 03/28/2023   TRIG 277 (H) 03/28/2023   CHOLHDL 5.8 (H) 03/28/2023    Significant Diagnostic Results in last 30 days:  No results found.  Assessment/Plan  1. COVID-19 virus infection Reports chills and feeling feverish COVID-19 test positive yesterday. - start on Z-pak  for Cough - start on Paxlovid side effects discussed  - azithromycin (ZITHROMAX) 250 MG tablet; Take 2 tablets on day 1, then 1 tablet daily on days 2 through 5  Dispense: 6 tablet; Refill: 0 - nirmatrelvir/ritonavir (PAXLOVID) 20 x 150 MG & 10 x 100MG  TABS; Take 3 tablets by mouth 2 (two) times daily for 5 days. (Take nirmatrelvir 150 mg two tablets twice daily for 5 days and ritonavir 100 mg one tablet twice daily for 5 days) Patient GFR is 110  Dispense: 30 tablet; Refill: 0 - zinc gluconate 50 MG tablet; Take 1 tablet (50 mg total) by mouth daily for 14 days.  Dispense: 14 tablet; Refill: 0 - vitamin [name] (ASCORBIC ACID) 250 MG tablet; Take 1 tablet (250 mg total) by mouth 2 (two) times daily for 14 days.  Dispense: 28 tablet; Refill: 0 - Cholecalciferol (VITAMIN D3) 50 MCG (2000 UT) capsule; Take 1 capsule (2,000 Units total) by mouth daily for 14 days.  Dispense: 14 capsule;  Refill: 0 -Advised to notify provider or go to the ED if symptoms worsen or fail to improve  2. Upper respiratory tract infection due to COVID-19 virus Supportive care with OTC supplement as below - encouraged to increase fluids/soups or herbal teas - azithromycin (ZITHROMAX) 250 MG tablet; Take 2 tablets on day 1, then 1 tablet daily on days 2 through 5  Dispense: 6 tablet; Refill: 0 - nirmatrelvir/ritonavir (PAXLOVID) 20 x 150 MG & 10 x 100MG  TABS; Take 3 tablets by mouth 2 (two) times daily for 5 days. (Take nirmatrelvir 150 mg two tablets twice daily for 5 days and ritonavir 100 mg one tablet twice daily for 5 days) Patient GFR is 110  Dispense: 30 tablet; Refill: 0 - zinc gluconate 50 MG tablet; Take 1 tablet (50 mg total) by mouth daily for 14 days.  Dispense: 14 tablet; Refill: 0 - vitamin [name] (ASCORBIC ACID) 250 MG tablet; Take 1 tablet (250 mg total) by mouth 2 (two) times daily for 14 days.  Dispense: 28 tablet; Refill: 0 - Cholecalciferol (VITAMIN D3) 50 MCG (2000 UT) capsule; Take 1 capsule (2,000 Units total) by  mouth daily for 14 days.  Dispense: 14 capsule; Refill: 0 - guaiFENesin (MUCINEX) 600 MG 12 hr tablet; Take 1 tablet (600 mg total) by mouth 2 (two) times daily for 14 days.  Dispense: 28 tablet; Refill: 0  Family/ staff Communication: Reviewed plan of care with patient verbalized understanding  Labs/tests ordered: None   Next Appointment: Return if symptoms worsen or fail to improve.  I connected with  Rick [name] on 06/30/2023 by a video enabled telemedicine application and verified that I am speaking with the correct person using two identifiers.   I discussed the limitations of evaluation and management by telemedicine. The patient expressed understanding and agreed to proceed.   Spent 12 minutes of face to face with patient  >50% time spent counseling; reviewing medical record; labs; and developing future plan of care.  Caesar Bookman, Rick [name]

## 2023-07-01 ENCOUNTER — Ambulatory Visit: Payer: BC Managed Care – PPO | Admitting: Family

## 2023-07-15 DIAGNOSIS — D101 Benign neoplasm of tongue: Secondary | ICD-10-CM | POA: Diagnosis not present

## 2023-07-15 DIAGNOSIS — H9311 Tinnitus, right ear: Secondary | ICD-10-CM | POA: Diagnosis not present

## 2023-08-11 ENCOUNTER — Other Ambulatory Visit: Payer: Self-pay | Admitting: Family

## 2023-08-11 DIAGNOSIS — F331 Major depressive disorder, recurrent, moderate: Secondary | ICD-10-CM

## 2023-08-11 DIAGNOSIS — F908 Attention-deficit hyperactivity disorder, other type: Secondary | ICD-10-CM

## 2023-08-11 MED ORDER — BUPROPION HCL ER (XL) 300 MG PO TB24: 300.0000 mg | ORAL_TABLET | Freq: Every day | ORAL | 1 refills | Status: DC

## 2023-08-11 NOTE — Telephone Encounter (Signed)
Methylphenidate:  Patient is requesting a refill of the following medications: Requested Prescriptions   Pending Prescriptions Disp Refills   methylphenidate (RITALIN LA) 20 MG 24 hr capsule [Pharmacy Med Name: methylphenidate LA 20 mg biphasic 50-50 capsule,extended release] 30 capsule 0    Sig: TAKE ONE CAPSULE EVERY MORNING   Refused Prescriptions Disp Refills   buPROPion (WELLBUTRIN XL) 150 MG 24 hr tablet [Pharmacy Med Name: bupropion HCl XL 150 mg 24 hr tablet, extended release] 30 tablet 2    Sig: TAKE ONE TABLET DAILY    Refused By: Edison Simon C    Reason for Refusal: Refill not appropriate    Date of last refill: 06/06/23  Refill amount: 30  Treatment agreement date: 03/26/24

## 2023-08-21 DIAGNOSIS — D101 Benign neoplasm of tongue: Secondary | ICD-10-CM | POA: Diagnosis not present

## 2023-09-19 ENCOUNTER — Encounter: Payer: Self-pay | Admitting: Family

## 2023-09-19 ENCOUNTER — Ambulatory Visit: Payer: BC Managed Care – PPO | Admitting: Family

## 2023-09-19 VITALS — BP 136/88 | HR 88 | Temp 96.7°F | Resp 20 | Ht 71.5 in | Wt 165.4 lb

## 2023-09-19 DIAGNOSIS — F909 Attention-deficit hyperactivity disorder, unspecified type: Secondary | ICD-10-CM | POA: Diagnosis not present

## 2023-09-19 DIAGNOSIS — Z23 Encounter for immunization: Secondary | ICD-10-CM | POA: Diagnosis not present

## 2023-09-19 DIAGNOSIS — Z5181 Encounter for therapeutic drug level monitoring: Secondary | ICD-10-CM | POA: Diagnosis not present

## 2023-09-19 DIAGNOSIS — E782 Mixed hyperlipidemia: Secondary | ICD-10-CM | POA: Diagnosis not present

## 2023-09-19 DIAGNOSIS — F331 Major depressive disorder, recurrent, moderate: Secondary | ICD-10-CM

## 2023-09-19 NOTE — Progress Notes (Unsigned)
Provider: Richarda Blade FNP-C   Velera Lansdale, Donalee Citrin, NP  Patient Care Team: Brazil Voytko, Donalee Citrin, NP as PCP - General (Family Medicine) Tyrone Schimke, MD as Consulting Physician (Ophthalmology)  Extended Emergency Contact Information Primary Emergency Contact: Miers,[name] Address: [address]          [place], [place] [address] D[place] of [place] Phone: [phone] Relation: Mother Secondary Emergency Contact: Deyoung,[name] Mobile Phone: [phone] Relation: Brother Preferred language: English Interpreter needed? No  Code Status:  Full Code Goals of care: Advanced Directive information    08/07/2014    4:46 PM  Advanced Directives  Does Patient Have a Medical Advance Directive? No  Would patient like information on creating a medical advance directive? Yes - Educational materials given     No chief complaint on file.   HPI:  Pt is a 32 y.o. male seen today for  6 months medical management of chronic diseases.   ADHD - on Ritalin   Depression - stable on Wellbutrin  Has gained weight since last visit.states has not been exercise.    Past Medical History:  Diagnosis Date   ADHD (attention deficit hyperactivity disorder)    Depression    Keratoconus    Tinnitus of right ear    Past Surgical History:  Procedure Laterality Date   HERNIA REPAIR     WISDOM TOOTH EXTRACTION      No Known Allergies  Allergies as of 09/19/2023   No Known Allergies      Medication List        Accurate as of September 19, 2023  3:38 PM. If you have any questions, ask your nurse or doctor.          buPROPion 300 MG 24 hr tablet Commonly known as: WELLBUTRIN XL Take 1 tablet (300 mg total) by mouth daily.   methylphenidate 20 MG 24 hr capsule Commonly known as: RITALIN LA TAKE ONE CAPSULE EVERY MORNING        Review of Systems  Constitutional:  Negative for appetite change, chills, fatigue, fever and unexpected weight change.  HENT:  Negative  for congestion, dental problem, ear discharge, ear pain, facial swelling, hearing loss, nosebleeds, postnasal drip, rhinorrhea, sinus pressure, sinus pain, sneezing, sore throat, tinnitus and trouble swallowing.   Eyes:  Negative for pain, discharge, redness, itching and visual disturbance.  Respiratory:  Negative for cough, chest tightness, shortness of breath and wheezing.   Cardiovascular:  Negative for chest pain, palpitations and leg swelling.  Gastrointestinal:  Negative for abdominal distention, abdominal pain, blood in stool, constipation, diarrhea, nausea and vomiting.  Endocrine: Negative for cold intolerance, heat intolerance, polydipsia, polyphagia and polyuria.  Genitourinary:  Negative for difficulty urinating, dysuria, flank pain, frequency and urgency.  Musculoskeletal:  Negative for arthralgias, back pain, gait problem, joint swelling, myalgias, neck pain and neck stiffness.  Skin:  Negative for color change, pallor, rash and wound.  Neurological:  Negative for dizziness, syncope, speech difficulty, weakness, light-headedness, numbness and headaches.  Hematological:  Does not bruise/bleed easily.  Psychiatric/Behavioral:  Negative for agitation, behavioral problems, confusion, hallucinations, self-injury, sleep disturbance and suicidal ideas. The patient is not nervous/anxious.     Immunization History  Administered Date(s) Administered   DTaP 10/11/1993, 12/05/1993, 02/14/1994, 09/26/1995, 06/29/1998   HIB (PRP-OMP) 10/11/1993, 12/05/1993, 02/14/1994, 12/01/1994   Hepatitis A 08/13/2005, 03/21/2006   Hepatitis B 11/07/1993, 03/17/1994   Hpv-Unspecified 12/12/2009, 02/12/2010, 06/19/2010   Influenza, Seasonal, Injecte, Preservative Fre 09/19/2023   MMR 12/01/1994, 08/09/1997  Meningococcal Mcv4o 08/13/2005, 02/08/2014   OPV 10/11/1993, 12/05/1993, 02/14/1994, 06/29/1998   Pneumococcal Polysaccharide-23 09/21/1997   Td 02/08/2014   Tdap 08/13/2005, 05/03/2021   Varicella  12/01/1994, 09/26/2007   Pertinent  Health Maintenance Due  Topic Date Due   INFLUENZA VACCINE  Completed      06/30/2023    2:32 PM  Fall Risk  Falls in the past year? 0  Was there an injury with Fall? 0  Fall Risk Category Calculator 0  Patient at Risk for Falls Due to No Fall Risks  Fall risk Follow up Falls evaluation completed   Functional Status Survey:    Vitals:   09/19/23 1516  BP: 136/88  Pulse: 88  Resp: 20  Temp: (!) 96.7 F (35.9 C)  SpO2: 98%  Weight: 165 lb 6.4 oz (75 kg)  Height: 5' 11.5" (1.816 m)   Body mass index is 22.75 kg/m. Physical Exam Vitals reviewed.  Constitutional:      General: He is not in acute distress.    Appearance: Normal appearance. He is normal weight. He is not ill-appearing or diaphoretic.  HENT:     Head: Normocephalic.     Right Ear: Tympanic membrane, ear canal and external ear normal. There is no impacted cerumen.     Left Ear: Tympanic membrane, ear canal and external ear normal. There is no impacted cerumen.     Nose: Nose normal. No congestion or rhinorrhea.     Mouth/Throat:     Mouth: Mucous membranes are moist.     Pharynx: Oropharynx is clear. No oropharyngeal exudate or posterior oropharyngeal erythema.  Eyes:     General: No scleral icterus.       Right eye: No discharge.        Left eye: No discharge.     Extraocular Movements: Extraocular movements intact.     Conjunctiva/sclera: Conjunctivae normal.     Pupils: Pupils are equal, round, and reactive to light.  Neck:     Vascular: No carotid bruit.  Cardiovascular:     Rate and Rhythm: Normal rate and regular rhythm.     Pulses: Normal pulses.     Heart sounds: Normal heart sounds. No murmur heard.    No friction rub. No gallop.  Pulmonary:     Effort: Pulmonary effort is normal. No respiratory distress.     Breath sounds: Normal breath sounds. No wheezing, rhonchi or rales.  Chest:     Chest wall: No tenderness.  Abdominal:     General: Bowel sounds  are normal. There is no distension.     Palpations: Abdomen is soft. There is no mass.     Tenderness: There is no abdominal tenderness. There is no right CVA tenderness, left CVA tenderness, guarding or rebound.  Musculoskeletal:        General: No swelling or tenderness. Normal range of motion.     Cervical back: Normal range of motion. No rigidity or tenderness.     Right lower leg: No edema.     Left lower leg: No edema.  Lymphadenopathy:     Cervical: No cervical adenopathy.  Skin:    General: Skin is warm and dry.     Coloration: Skin is not pale.     Findings: No bruising, erythema, lesion or rash.  Neurological:     Mental Status: He is alert and oriented to person, place, and time.     Cranial Nerves: No cranial nerve deficit.     Sensory: No  sensory deficit.     Motor: No weakness.     Coordination: Coordination normal.     Gait: Gait normal.  Psychiatric:        Mood and Affect: Mood normal.        Speech: Speech normal.        Behavior: Behavior normal.        Thought Content: Thought content normal.        Judgment: Judgment normal.     Labs reviewed: Recent Labs    03/28/23 0847  NA 141  K 4.3  CL 103  CO2 28  GLUCOSE 94  BUN 23  CREATININE 0.96  CALCIUM 9.9   Recent Labs    03/28/23 0847  AST 18  ALT 27  BILITOT 0.7  PROT 7.4   Recent Labs    03/28/23 0847  WBC 6.9  NEUTROABS 2,988  HGB 15.6  HCT 46.2  MCV 88.0  PLT 269   Lab Results  Component Value Date   TSH 0.84 03/28/2023   No results found for: "HGBA1C" Lab Results  Component Value Date   CHOL 197 03/28/2023   HDL 34 (L) 03/28/2023   LDLCALC 120 (H) 03/28/2023   TRIG 277 (H) 03/28/2023   CHOLHDL 5.8 (H) 03/28/2023    Significant Diagnostic Results in last 30 days:  No results found.  Assessment/Plan 1. Need for influenza vaccination *** - Flu vaccine trivalent PF, 6mos and older(Flulaval,Afluria,Fluarix,Fluzone)    Family/ staff Communication: Reviewed plan of  care with patient  Labs/tests ordered: None   Next Appointment :   Rick Bookman, NP

## 2023-09-20 LAB — DRUG MONITORING, PANEL 6 WITH CONFIRMATION, URINE
6 Acetylmorphine: NEGATIVE ng/mL (ref <10)
Alcohol Metabolites: NEGATIVE ng/mL (ref <500)
Amphetamines: NEGATIVE ng/mL (ref <500)
Barbiturates: NEGATIVE ng/mL (ref <300)
Benzodiazepines: NEGATIVE ng/mL (ref <100)
Cocaine Metabolite: NEGATIVE ng/mL (ref <150)
Creatinine: 76.9 mg/dL (ref >=20.0)
Marijuana Metabolite: NEGATIVE ng/mL (ref <20)
Methadone Metabolite: NEGATIVE ng/mL (ref <100)
Opiates: NEGATIVE ng/mL (ref <100)
Oxidant: NEGATIVE ug/mL (ref <200)
Oxycodone: NEGATIVE ng/mL (ref <100)
Phencyclidine: NEGATIVE ng/mL (ref <25)
pH: 7 (ref 4.5–9.0)

## 2023-09-20 LAB — DM TEMPLATE

## 2023-09-22 ENCOUNTER — Other Ambulatory Visit: Payer: BC Managed Care – PPO

## 2023-09-22 ENCOUNTER — Other Ambulatory Visit: Payer: Self-pay

## 2023-09-22 DIAGNOSIS — F909 Attention-deficit hyperactivity disorder, unspecified type: Secondary | ICD-10-CM | POA: Diagnosis not present

## 2023-09-22 DIAGNOSIS — E782 Mixed hyperlipidemia: Secondary | ICD-10-CM

## 2023-09-22 DIAGNOSIS — F331 Major depressive disorder, recurrent, moderate: Secondary | ICD-10-CM | POA: Diagnosis not present

## 2023-09-23 ENCOUNTER — Other Ambulatory Visit: Payer: Self-pay | Admitting: Family

## 2023-09-23 DIAGNOSIS — F908 Attention-deficit hyperactivity disorder, other type: Secondary | ICD-10-CM

## 2023-09-23 LAB — LIPID PANEL
Cholesterol: 189 mg/dL (ref <200)
HDL: 38 mg/dL — ABNORMAL LOW (ref >=40)
LDL Cholesterol (Calc): 116 mg/dL — ABNORMAL HIGH
Non-HDL Cholesterol (Calc): 151 mg/dL — ABNORMAL HIGH (ref <130)
Total CHOL/HDL Ratio: 5 (calc) — ABNORMAL HIGH (ref <5.0)
Triglycerides: 234 mg/dL — ABNORMAL HIGH (ref <150)

## 2023-09-23 LAB — CBC WITH DIFFERENTIAL/PLATELET
Absolute Lymphocytes: 3541 {cells}/uL (ref 850–3900)
Absolute Monocytes: 632 {cells}/uL (ref 200–950)
Basophils Absolute: 31 {cells}/uL (ref 0–200)
Basophils Relative: 0.4 %
Eosinophils Absolute: 140 {cells}/uL (ref 15–500)
Eosinophils Relative: 1.8 %
HCT: 47.4 % (ref 38.5–50.0)
Hemoglobin: 15.7 g/dL (ref 13.2–17.1)
MCH: 29.5 pg (ref 27.0–33.0)
MCHC: 33.1 g/dL (ref 32.0–36.0)
MCV: 88.9 fL (ref 80.0–100.0)
MPV: 9.5 fL (ref 7.5–12.5)
Monocytes Relative: 8.1 %
Neutro Abs: 3455 {cells}/uL (ref 1500–7800)
Neutrophils Relative %: 44.3 %
Platelets: 293 10*3/uL (ref 140–400)
RBC: 5.33 10*6/uL (ref 4.20–5.80)
RDW: 13 % (ref 11.0–15.0)
Total Lymphocyte: 45.4 %
WBC: 7.8 10*3/uL (ref 3.8–10.8)

## 2023-09-23 LAB — COMPLETE METABOLIC PANEL WITH GFR
AG Ratio: 2 (calc) (ref 1.0–2.5)
ALT: 25 U/L (ref 9–46)
AST: 16 U/L (ref 10–40)
Albumin: 4.7 g/dL (ref 3.6–5.1)
Alkaline phosphatase (APISO): 60 U/L (ref 36–130)
BUN: 23 mg/dL (ref 7–25)
CO2: 27 mmol/L (ref 20–32)
Calcium: 9.8 mg/dL (ref 8.6–10.3)
Chloride: 106 mmol/L (ref 98–110)
Creat: 0.82 mg/dL (ref 0.60–1.26)
Globulin: 2.4 g/dL (ref 1.9–3.7)
Glucose, Bld: 98 mg/dL (ref 65–99)
Potassium: 4.1 mmol/L (ref 3.5–5.3)
Sodium: 139 mmol/L (ref 135–146)
Total Bilirubin: 0.4 mg/dL (ref 0.2–1.2)
Total Protein: 7.1 g/dL (ref 6.1–8.1)
eGFR: 121 mL/min/{1.73_m2} (ref >=60)

## 2023-09-23 LAB — TSH: TSH: 1.9 m[IU]/L (ref 0.40–4.50)

## 2023-09-23 NOTE — Telephone Encounter (Signed)
Next refill will need to be managed by psychiatrist recent drug test was negative for prescribed Ritalin.will no longer prescribe medication going forward.
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
## 2023-10-17 ENCOUNTER — Encounter (HOSPITAL_COMMUNITY): Payer: Self-pay | Admitting: Psychiatry

## 2023-10-17 ENCOUNTER — Ambulatory Visit (HOSPITAL_BASED_OUTPATIENT_CLINIC_OR_DEPARTMENT_OTHER): Payer: BC Managed Care – PPO | Admitting: Psychiatry

## 2023-10-17 DIAGNOSIS — F908 Attention-deficit hyperactivity disorder, other type: Secondary | ICD-10-CM

## 2023-10-17 DIAGNOSIS — F331 Major depressive disorder, recurrent, moderate: Secondary | ICD-10-CM

## 2023-10-17 MED ORDER — METHYLPHENIDATE HCL ER (LA) 20 MG PO CP24: 20.0000 mg | ORAL_CAPSULE | Freq: Every morning | ORAL | 0 refills | Status: DC

## 2023-10-17 MED ORDER — METHYLPHENIDATE HCL ER (LA) 20 MG PO CP24: 20.0000 mg | ORAL_CAPSULE | ORAL | 0 refills | Status: DC

## 2023-10-17 MED ORDER — BUPROPION HCL ER (XL) 300 MG PO TB24: 300.0000 mg | ORAL_TABLET | Freq: Every day | ORAL | 0 refills | Status: DC

## 2023-12-01 NOTE — Progress Notes (Unsigned)
BH MD/PA/NP OP Progress Note  12/02/2023 4:06 PM Rick [name]  MRN:  [id number]  Visit Diagnosis:    ICD-10-CM   1. Moderate episode of recurrent major depressive disorder (HCC)  F33.1 buPROPion 450 MG TB24    2. Attention-deficit hyperactivity disorder, other type  F90.8 methylphenidate (RITALIN) 5 MG tablet    methylphenidate (RITALIN LA) 10 MG 24 hr capsule    methylphenidate (RITALIN LA) 10 MG 24 hr capsule    methylphenidate (RITALIN) 5 MG tablet      Assessment: Rick [name] is a 33 y.o. male with a history of  MDD and ADHD who presents virtually to [facility] at [facility] for initial evaluation on 10/17/2023.  At initial evaluation patient reported a history of depression that has been well controlled.  Patient reported symptoms began around 2009 at which time he endorsed low mood, increased isolation, negative self thoughts, feelings of worthlessness, and passive SI.  He denied ever experiencing any active SI.  Patient also was diagnosed with ADD at that time reporting having completed neuropsych testing and experiencing poor attention and concentration.  This has also been stable on medication for a number of years prior to initial evaluation.  Patient met criteria for MDD currently in remission and ADHD on initial evaluation.  Rick [name] presents for follow-up evaluation. Today, 12/02/23, patient reports some anhedonia, difficulty with insomnia, and low mood.  We will titrate Wellbutrin to 450 mg daily and decrease Ritalin to 10 mg daily with 5 mg immediate release booster to be taken around noon as needed.  Risk and benefits of both medications were discussed.  Of note patient did send in his neuropsych testing which confirmed the ADHD diagnosis.  Plan: - Continue Wellbutrin xl 450 mg daily - Decrease Ritalin LA to 10 mg daily  - Start Ritalin IR 5 mg Q12-2pm daily - CBC, CMP, lipid profile, vitamin D, TSH, and U-Tox reviewed - Neuropsych  testing, reviewed and confirmed ADHD diagnosis - Therapy referral - Crisis resources reviewed - Follow up in 2 months    Chief Complaint:  Chief Complaint  Patient presents with   Follow-up   HPI: Rick [name] presents reporting that his mood has been stable the past month though he does mention that he has not the happiest overall.  He still kept busy with caring for his mother though notes that this past year has been a bit easier compared to previous years.  Patient had eye surgery a couple weeks ago and he accompanied his mother to [place] for some doctor's appointments recently.  On review of patient's mood symptoms he endorses still having motivation and energy but he is not finding joy in things as much.  He does believe this to them better on Wellbutrin 450 in the past.  He does not recall any reason for the decrease other than trying an lower dose.  That being the case we were agreeable to increasing the dose back to 450 mg daily today.  Patient also reports that he has struggled with some increase in insomnia and wonders if his Ritalin dose is too high.  He does not feel the medication wearing off during the day she does raise concern that the dose is lasting too long.  We will trial a decrease from 20 mg down to 10 mg.  We will also add up 5 mg immediate release booster to be used as needed around noon.  Risks and benefits of this adjustment were discussed.  Past Psychiatric History: Had seen a psychiatrist in 2009 who dx him with ADD and depression. His PCP have managed his medications since. He saw them for a few visits. He then saw a therapist for 3 years followed by one off and on for 5 years throughout college.  Patient denies any prior psychiatric hospitalizations or past suicide attempts.  Patient has taken Ritalin and Xanax in the past for sleep  Has a drink every 1-2 weeks. Tried marijuana in college gave him anxiety. Occasional coffee.    Past Medical History:  Past Medical  History:  Diagnosis Date   ADHD (attention deficit hyperactivity disorder)    Depression    Keratoconus    Tinnitus of right ear     Past Surgical History:  Procedure Laterality Date   HERNIA REPAIR     WISDOM TOOTH EXTRACTION      Family History:  Family History  Problem Relation Age of Onset   Stroke Mother    Cancer Maternal Grandmother    Heart attack Maternal Grandfather    Epilepsy Maternal Grandfather     Social History:  Social History   Socioeconomic History   Marital status: Single    Spouse name: Not on file   Number of children: Not on file   Years of education: Not on file   Highest education level: Not on file  Occupational History   Not on file  Tobacco Use   Smoking status: Never   Smokeless tobacco: Never  Vaping Use   Vaping status: Never Used  Substance and Sexual Activity   Alcohol use: Yes   Drug use: Not Currently    Types: Marijuana   Sexual activity: Not on file  Other Topics Concern   Not on file  Social History Narrative   Not on file   Social Drivers of Health   Financial Resource Strain: Not on file  Food Insecurity: Not on file  Transportation Needs: Not on file  Physical Activity: Not on file  Stress: Not on file  Social Connections: Not on file    Allergies: No Known Allergies  Current Medications: Current Outpatient Medications  Medication Sig Dispense Refill   [START ON 12/31/2023] methylphenidate (RITALIN LA) 10 MG 24 hr capsule Take 1 capsule (10 mg total) by mouth daily. 30 capsule 0   methylphenidate (RITALIN LA) 10 MG 24 hr capsule Take 1 capsule (10 mg total) by mouth daily. 30 capsule 0   [START ON 12/31/2023] methylphenidate (RITALIN) 5 MG tablet Take 1 tablet (5 mg total) by mouth daily. Take between noon and 2 PM 30 tablet 0   methylphenidate (RITALIN) 5 MG tablet Take 1 tablet (5 mg total) by mouth daily as needed. Take between noon and 2 pm 30 tablet 0   buPROPion 450 MG TB24 Take 1 tablet (450 mg total) by  mouth daily. 90 tablet 0   No current facility-administered medications for this visit.     Psychiatric Specialty Exam: Review of Systems  There were no vitals taken for this visit.There is no height or weight on file to calculate BMI.  General Appearance: Well Groomed  Eye Contact:  Good  Speech:  Clear and Coherent  Volume:  Normal  Mood:   dysthimic  Affect:  Appropriate  Thought Process:  Coherent  Orientation:  Full (Time, Place, and Person)  Thought Content: Logical   Suicidal Thoughts:  No  Homicidal Thoughts:  No  Memory:  Immediate;   Good  Judgement:  Good  Insight:  Fair  Psychomotor Activity:  Normal  Concentration:  Concentration: Good  Recall:  Good  Fund of Knowledge: Good  Language: Good  Akathisia:  NA    AIMS (if indicated): not done  Assets:  Communication Skills Desire for Improvement Financial Resources/Insurance Housing Talents/Skills Transportation  ADL's:  Intact  Cognition: WNL  Sleep:  Fair   Metabolic Disorder Labs: No results found for: "HGBA1C", "MPG" No results found for: "PROLACTIN" Lab Results  Component Value Date   CHOL 189 09/22/2023   TRIG 234 (H) 09/22/2023   HDL 38 (L) 09/22/2023   CHOLHDL 5.0 (H) 09/22/2023   LDLCALC 116 (H) 09/22/2023   LDLCALC 120 (H) 03/28/2023   Lab Results  Component Value Date   TSH 1.90 09/22/2023   TSH 0.84 03/28/2023    Therapeutic Level Labs: No results found for: "LITHIUM" No results found for: "VALPROATE" No results found for: "CBMZ"   Screenings: GAD-7    Flowsheet Row Office Visit from 10/17/2023 in [facility]  Total GAD-7 Score 4      PHQ2-9    Flowsheet Row Office Visit from 10/17/2023 in [facility] Video Visit from 06/30/2023 in [facility] Office Visit from 03/27/2023 in [facility]  PHQ-2 Total Score 1 0 2  PHQ-9  Total Score 5 0 7      Flowsheet Row Office Visit from 10/17/2023 in [facility]  C-SSRS RISK CATEGORY No Risk       Collaboration of Care: Collaboration of Care: Medication Management AEB medication prescription and Psychiatrist AEB old records reviewed  Patient/Guardian was advised Release of Information must be obtained prior to any record release in order to collaborate their care with an outside provider. Patient/Guardian was advised if they have not already done so to contact the registration department to sign all necessary forms in order for [place] to release information regarding their care.   Consent: Patient/Guardian gives verbal consent for treatment and assignment of benefits for services provided during this visit. Patient/Guardian expressed understanding and agreed to proceed.    Stasia Cavalier, MD 12/02/2023, 4:06 PM   Virtual Visit via Video Note  I connected with Rick [name] on 12/02/23 at  3:00 PM EST by a video enabled telemedicine application and verified that I am speaking with the correct person using two identifiers.  Location: Patient: Home Provider: Home Office   I discussed the limitations of evaluation and management by telemedicine and the availability of in person appointments. The patient expressed understanding and agreed to proceed.   I discussed the assessment and treatment plan with the patient. The patient was provided an opportunity to ask questions and all were answered. The patient agreed with the plan and demonstrated an understanding of the instructions.   The patient was advised to call back or seek an in-person evaluation if the symptoms worsen or if the condition fails to improve as anticipated.  I provided 20 minutes of non-face-to-face time during this encounter.   Stasia Cavalier, MD

## 2023-12-02 ENCOUNTER — Telehealth (HOSPITAL_BASED_OUTPATIENT_CLINIC_OR_DEPARTMENT_OTHER): Payer: BC Managed Care – PPO | Admitting: Psychiatry

## 2023-12-02 DIAGNOSIS — F331 Major depressive disorder, recurrent, moderate: Secondary | ICD-10-CM

## 2023-12-02 DIAGNOSIS — F908 Attention-deficit hyperactivity disorder, other type: Secondary | ICD-10-CM

## 2023-12-02 MED ORDER — METHYLPHENIDATE HCL 5 MG PO TABS: 5.0000 mg | ORAL_TABLET | Freq: Every day | ORAL | 0 refills | Status: DC | PRN

## 2023-12-02 MED ORDER — METHYLPHENIDATE HCL ER (LA) 10 MG PO CP24: 10.0000 mg | ORAL_CAPSULE | Freq: Every day | ORAL | 0 refills | Status: DC

## 2023-12-02 MED ORDER — BUPROPION HCL ER (XL) 450 MG PO TB24: 450.0000 mg | ORAL_TABLET | Freq: Every day | ORAL | 0 refills | Status: DC

## 2023-12-02 MED ORDER — METHYLPHENIDATE HCL 5 MG PO TABS: 5.0000 mg | ORAL_TABLET | Freq: Every day | ORAL | 0 refills | Status: DC

## 2023-12-03 ENCOUNTER — Encounter (HOSPITAL_COMMUNITY): Payer: Self-pay | Admitting: Psychiatry

## 2023-12-09 ENCOUNTER — Other Ambulatory Visit (HOSPITAL_COMMUNITY): Payer: Self-pay | Admitting: *Deleted

## 2023-12-09 ENCOUNTER — Ambulatory Visit (HOSPITAL_COMMUNITY): Payer: Self-pay | Admitting: Licensed Clinical Social Worker

## 2023-12-09 MED ORDER — BUPROPION HCL ER (XL) 150 MG PO TB24: 150.0000 mg | ORAL_TABLET | ORAL | 1 refills | Status: DC

## 2023-12-09 MED ORDER — BUPROPION HCL ER (XL) 300 MG PO TB24: 300.0000 mg | ORAL_TABLET | ORAL | 1 refills | Status: DC

## 2024-01-19 ENCOUNTER — Telehealth (HOSPITAL_COMMUNITY): Payer: Self-pay | Admitting: *Deleted

## 2024-01-19 NOTE — Telephone Encounter (Signed)
 Pt called requesting refills of both the Ritalin 10 mg LA and the Ritalin 5 mg IR. Looks they both had a earliest fill date of 12/31/23. Pt last visit was on 12/02/23 and he has a f/u scheduled for 01/29/24. Please review.

## 2024-01-19 NOTE — Telephone Encounter (Signed)
 Writer spoke with pharmacy to check fill dates, availability, of both the Ritalin LA 10 mg  and the IR 5 mg. Pharmacy did find the scripts and is filling now. LVM for pt to advise this.

## 2024-01-19 NOTE — Telephone Encounter (Signed)
 Looks like the 12/31/23 was the refill. But I'll call pt pharmacy.

## 2024-01-20 NOTE — Telephone Encounter (Signed)
 No he did not. Pharmacy was to fill last eveing. I LVM for pt. I did put in an encounter.

## 2024-01-22 ENCOUNTER — Ambulatory Visit (HOSPITAL_COMMUNITY): Payer: BC Managed Care – PPO | Admitting: Licensed Clinical Social Worker

## 2024-01-28 NOTE — Progress Notes (Unsigned)
 BH MD/PA/NP OP Progress Note  01/29/2024 4:32 PM Rick [name]  MRN:  [id number]  Visit Diagnosis:    ICD-10-CM   1. Moderate episode of recurrent major depressive disorder (HCC)  F33.1 buPROPion (WELLBUTRIN XL) 150 MG 24 hr tablet    buPROPion (WELLBUTRIN XL) 300 MG 24 hr tablet    2. Attention-deficit hyperactivity disorder, other type  F90.8 buPROPion (WELLBUTRIN XL) 150 MG 24 hr tablet    buPROPion (WELLBUTRIN XL) 300 MG 24 hr tablet    methylphenidate (RITALIN) 5 MG tablet    methylphenidate (RITALIN LA) 10 MG 24 hr capsule       Assessment: Rick [name] is a 33 y.o. male with a history of  MDD and ADHD who presents virtually to [facility] for initial evaluation on 10/17/2023.  At initial evaluation patient reported a history of depression that has been well controlled.  Patient reported symptoms began around 2009 at which time he endorsed low mood, increased isolation, negative self thoughts, feelings of worthlessness, and passive SI.  He denied ever experiencing any active SI.  Patient also was diagnosed with ADD at that time reporting having completed neuropsych testing and experiencing poor attention and concentration.  This has also been stable on medication for a number of years prior to initial evaluation.  Patient met criteria for MDD currently in remission and ADHD on initial evaluation.  [name] presents for follow-up evaluation. Today, 01/29/24, patient reports    some anhedonia, difficulty with insomnia, and low mood.  We will titrate Wellbutrin to 450 mg daily and decrease Ritalin to 10 mg daily with 5 mg immediate release booster to be taken around noon as needed.  Risk and benefits of both medications were discussed.  Of note patient did send in his neuropsych testing which confirmed the ADHD diagnosis.  Plan: - Continue Wellbutrin xl 450 mg daily - Continue Ritalin LA 10 mg daily  - Continue Ritalin IR 5 mg Q12-2pm  daily - CBC, CMP, lipid profile, vitamin D, TSH, and U-Tox reviewed - Neuropsych testing, reviewed and confirmed ADHD diagnosis - Therapy referral - Crisis resources reviewed - Follow up in 2 months    Chief Complaint:  Chief Complaint  Patient presents with   Follow-up   HPI: Rick [name] presents reporting that things have been going alright. Just getting back from [place] for his moms his surgery. The surgery is in a couple weeks   Mood has been pretty good the past couple months. He thinks are fairly stable  The ritalin is working better on the new dosing   Sleep got better, likely due to moving the Ritalin LA earlier in day    his mood has been stable the past month though he does mention that he has not the happiest overall.  He still kept busy with caring for his mother though notes that this past year has been a bit easier compared to previous years.  Patient had eye surgery a couple weeks ago and he accompanied his mother to [place] for some doctor's appointments recently.  On review of patient's mood symptoms he endorses still having motivation and energy but he is not finding joy in things as much.  He does believe this to them better on Wellbutrin 450 in the past.  He does not recall any reason for the decrease other than trying an lower dose.  That being the case we were agreeable to increasing the dose back to 450 mg daily  today.  Patient also reports that he has struggled with some increase in insomnia and wonders if his Ritalin dose is too high.  He does not feel the medication wearing off during the day she does raise concern that the dose is lasting too long.  We will trial a decrease from 20 mg down to 10 mg.  We will also add up 5 mg immediate release booster to be used as needed around noon.  Risks and benefits of this adjustment were discussed.  Past Psychiatric History: Had seen a psychiatrist in 2009 who dx him with ADD and depression. His PCP have managed his  medications since. He saw them for a few visits. He then saw a therapist for 3 years followed by one off and on for 5 years throughout college.  Patient denies any prior psychiatric hospitalizations or past suicide attempts.  Patient has taken Ritalin and Xanax in the past for sleep  Has a drink every 1-2 weeks. Tried marijuana in college gave him anxiety. Occasional coffee.    Past Medical History:  Past Medical History:  Diagnosis Date   ADHD (attention deficit hyperactivity disorder)    Depression    Keratoconus    Tinnitus of right ear     Past Surgical History:  Procedure Laterality Date   HERNIA REPAIR     WISDOM TOOTH EXTRACTION      Family History:  Family History  Problem Relation Age of Onset   Stroke Mother    Cancer Maternal Grandmother    Heart attack Maternal Grandfather    Epilepsy Maternal Grandfather     Social History:  Social History   Socioeconomic History   Marital status: Single    Spouse name: Not on file   Number of children: Not on file   Years of education: Not on file   Highest education level: Not on file  Occupational History   Not on file  Tobacco Use   Smoking status: Never   Smokeless tobacco: Never  Vaping Use   Vaping status: Never Used  Substance and Sexual Activity   Alcohol use: Yes   Drug use: Not Currently    Types: Marijuana   Sexual activity: Not on file  Other Topics Concern   Not on file  Social History Narrative   Not on file   Social Drivers of Health   Financial Resource Strain: Not on file  Food Insecurity: Not on file  Transportation Needs: Not on file  Physical Activity: Not on file  Stress: Not on file  Social Connections: Not on file    Allergies: No Known Allergies  Current Medications: Current Outpatient Medications  Medication Sig Dispense Refill   buPROPion (WELLBUTRIN XL) 150 MG 24 hr tablet Take 1 tablet (150 mg total) by mouth every morning. 30 tablet 1   buPROPion (WELLBUTRIN XL) 300 MG  24 hr tablet Take 1 tablet (300 mg total) by mouth every morning. 30 tablet 1   methylphenidate (RITALIN LA) 10 MG 24 hr capsule Take 1 capsule (10 mg total) by mouth daily. 30 capsule 0   [START ON 02/16/2024] methylphenidate (RITALIN LA) 10 MG 24 hr capsule Take 1 capsule (10 mg total) by mouth daily. 30 capsule 0   methylphenidate (RITALIN) 5 MG tablet Take 1 tablet (5 mg total) by mouth daily. Take between noon and 2 PM 30 tablet 0   [START ON 02/16/2024] methylphenidate (RITALIN) 5 MG tablet Take 1 tablet (5 mg total) by mouth daily as needed. Take  between noon and 2 pm 30 tablet 0   No current facility-administered medications for this visit.     Psychiatric Specialty Exam: Review of Systems  There were no vitals taken for this visit.There is no height or weight on file to calculate BMI.  General Appearance: Well Groomed  Eye Contact:  Good  Speech:  Clear and Coherent  Volume:  Normal  Mood:   dysthimic  Affect:  Appropriate  Thought Process:  Coherent  Orientation:  Full (Time, Place, and Person)  Thought Content: Logical   Suicidal Thoughts:  No  Homicidal Thoughts:  No  Memory:  Immediate;   Good  Judgement:  Good  Insight:  Fair  Psychomotor Activity:  Normal  Concentration:  Concentration: Good  Recall:  Good  Fund of Knowledge: Good  Language: Good  Akathisia:  NA    AIMS (if indicated): not done  Assets:  Communication Skills Desire for Improvement Financial Resources/Insurance Housing Talents/Skills Transportation  ADL's:  Intact  Cognition: WNL  Sleep:  Fair   Metabolic Disorder Labs: No results found for: "HGBA1C", "MPG" No results found for: "PROLACTIN" Lab Results  Component Value Date   CHOL 189 09/22/2023   TRIG 234 (H) 09/22/2023   HDL 38 (L) 09/22/2023   CHOLHDL 5.0 (H) 09/22/2023   LDLCALC 116 (H) 09/22/2023   LDLCALC 120 (H) 03/28/2023   Lab Results  Component Value Date   TSH 1.90 09/22/2023   TSH 0.84 03/28/2023    Therapeutic  Level Labs: No results found for: "LITHIUM" No results found for: "VALPROATE" No results found for: "CBMZ"   Screenings: GAD-7    Flowsheet Row [facility]  Total GAD-7 Score 4      PHQ2-9    Flowsheet Row Office Visit [facility] 10/17/2023 in [facility] Video Visit from 06/30/2023 in [facility] Office Visit from 03/27/2023 in [facility]  PHQ-2 Total Score 1 0 2  PHQ-9 Total Score 5 0 7      Flowsheet Row Office Visit from 10/17/2023 in [facility]  C-SSRS RISK CATEGORY No Risk       Collaboration of Care: Collaboration of Care: Medication Management AEB medication prescription  Patient/Guardian was advised Release of Information must be obtained prior to any record release in order to collaborate their care with an outside provider. Patient/Guardian was advised if they have not already done so to contact the registration department to sign all necessary forms in order for [place] to release information regarding their care.   Consent: Patient/Guardian gives verbal consent for treatment and assignment of benefits for services provided during this visit. Patient/Guardian expressed understanding and agreed to proceed.    Stasia Cavalier, MD 01/29/2024, 4:32 PM   Virtual Visit via Video Note  I connected with Chriss Czar on 01/29/24 at  2:00 PM EDT by a video enabled telemedicine application and verified that I am speaking with the correct person using two identifiers.  Location: Patient: Home Provider: Home Office   I discussed the limitations of evaluation and management by telemedicine and the availability of in person appointments. The patient expressed understanding and agreed to proceed.   I discussed the assessment and treatment plan with the  patient. The patient was provided an opportunity to ask questions and all were answered. The patient agreed with the plan and demonstrated an understanding of the  instructions.   The patient was advised to call back or seek an in-person evaluation if the symptoms worsen or if the condition fails to improve as anticipated.  I provided 15 minutes of non-face-to-face time during this encounter.   Stasia Cavalier, MD

## 2024-01-29 ENCOUNTER — Telehealth (HOSPITAL_COMMUNITY): Payer: BC Managed Care – PPO | Admitting: Psychiatry

## 2024-01-29 DIAGNOSIS — F331 Major depressive disorder, recurrent, moderate: Secondary | ICD-10-CM | POA: Diagnosis not present

## 2024-01-29 DIAGNOSIS — F908 Attention-deficit hyperactivity disorder, other type: Secondary | ICD-10-CM

## 2024-01-29 MED ORDER — METHYLPHENIDATE HCL 5 MG PO TABS
5.0000 mg | ORAL_TABLET | Freq: Every day | ORAL | 0 refills | Status: DC | PRN
Start: 2024-02-16 — End: 2024-03-18

## 2024-01-29 MED ORDER — BUPROPION HCL ER (XL) 300 MG PO TB24: 300.0000 mg | ORAL_TABLET | ORAL | 1 refills | Status: DC

## 2024-01-29 MED ORDER — METHYLPHENIDATE HCL ER (LA) 10 MG PO CP24: 10.0000 mg | ORAL_CAPSULE | Freq: Every day | ORAL | 0 refills | Status: DC

## 2024-01-29 MED ORDER — BUPROPION HCL ER (XL) 150 MG PO TB24: 150.0000 mg | ORAL_TABLET | ORAL | 1 refills | Status: DC

## 2024-01-30 ENCOUNTER — Encounter (HOSPITAL_COMMUNITY): Payer: Self-pay | Admitting: Psychiatry

## 2024-02-04 ENCOUNTER — Ambulatory Visit (HOSPITAL_COMMUNITY): Payer: BC Managed Care – PPO | Admitting: Licensed Clinical Social Worker

## 2024-03-11 ENCOUNTER — Encounter: Payer: BC Managed Care – PPO | Admitting: Family

## 2024-03-15 NOTE — Progress Notes (Signed)
   This encounter was created in error - please disregard. No show

## 2024-03-15 NOTE — Progress Notes (Unsigned)
 BH MD/PA/NP OP Progress Note  03/18/2024 2:18 PM DRAYK PASSEY  MRN:  [id number]  Visit Diagnosis:    ICD-10-CM   1. Attention-deficit hyperactivity disorder, other type  F90.8 buPROPion  (WELLBUTRIN  XL) 300 MG 24 hr tablet    buPROPion  (WELLBUTRIN  XL) 150 MG 24 hr tablet    methylphenidate  (RITALIN  LA) 10 MG 24 hr capsule    methylphenidate  (RITALIN ) 5 MG tablet    methylphenidate  (RITALIN  LA) 10 MG 24 hr capsule    methylphenidate  (RITALIN ) 5 MG tablet    2. Moderate episode of recurrent major depressive disorder (HCC)  F33.1 buPROPion  (WELLBUTRIN  XL) 300 MG 24 hr tablet    buPROPion  (WELLBUTRIN  XL) 150 MG 24 hr tablet      Assessment: Rick [name] is a 33 y.o. male with a history of  MDD and ADHD who presented to [facility] at [facility] for initial evaluation on 10/17/2023.  At initial evaluation patient reported a history of depression that has been well controlled.  Patient reported symptoms began around 2009 at which time he endorsed low mood, increased isolation, negative self thoughts, feelings of worthlessness, and passive SI.  He denied ever experiencing any active SI.  Patient also was diagnosed with ADD at that time reporting having completed neuropsych testing and experiencing poor attention and concentration.  This has also been stable on medication for a number of years prior to initial evaluation.  Patient met criteria for MDD currently in remission and ADHD on initial evaluation.  Rick [name] presents for follow-up evaluation. Today, 03/18/24, patient reports that mood, anxiety, and ADHD symptoms evolved and stable.  Insomnia also remains improved with adjustment of Ritalin  dosing.  He is taking his medication consistently denying any adverse side effects other than decreased appetite.  Patient has lost roughly 10 pounds over the past 6 months.  He has not been eating regular meals during the day and instead been binging once the medication wears  off at night.  Recommended setting alarms so that he remembers to eat during the day.  Patient was open to trying this.  As his weight is currently at a health level with good BMI there is not significant concern.  If he were to continue to lose weight in the future we might need to consider tapering or discontinuing some of his medications since both the Ritalin  and Wellbutrin  can impact appetite.  We will continue on his current regimen and follow up in 3 months.  Plan: - Continue Wellbutrin  xl 450 mg daily - Continue Ritalin  LA 10 mg daily  - Continue Ritalin  IR 5 mg Q12-2pm daily - CBC, CMP, lipid profile, vitamin D , TSH, and U-Tox reviewed - Neuropsych testing, reviewed and confirmed ADHD diagnosis - Therapy referral - Crisis resources reviewed - Follow up in 3 months  Chief Complaint:  Chief Complaint  Patient presents with   Follow-up   HPI: Rick [name] presents reporting that not much has been going on other then caring for his mom. His mother had surgery a month ago and it went well. She is now PT which is going fairly well.   Rick [name] feels that his mood and anxiety have both been well controlled in the interim with no concerns.  His attention/concentration has also been good.  He switched the dosing of the Ritalin  to take the long-acting in the morning and the immediate release later in the afternoon.  With this he has had no further concerns about insomnia and concentration has still  been steady.  Patient has found that his appetite is a bit more decreased compared to the past.  He recently met with his primary and found that he dropped around 10 pounds over the past 6 months.  We did review the appetite suppression both the Wellbutrin  and the Ritalin  and how this might be impacting this.  Suggested scheduling meals during the day as currently he does not tend to eat until the evening which point he binges.  Patient was open to trying this and for now felt that he could manage keeping his  weight steady.  As he currently is 153 pounds with a BMI of 21 keeping steady would be appropriate.  If he continues to lose weight in the future we might need to consider decreasing his stimulant or Wellbutrin  prescriptions.  Past Psychiatric History: Had seen a psychiatrist in 2009 who dx him with ADD and depression. His PCP have managed his medications since. He saw them for a few visits. He then saw a therapist for 3 years followed by one off and on for 5 years throughout college.  Patient denies any prior psychiatric hospitalizations or past suicide attempts.  Patient has taken Ritalin  and Xanax in the past for sleep  Has a drink every 1-2 weeks. Tried marijuana in college gave him anxiety. Occasional coffee.    Past Medical History:  Past Medical History:  Diagnosis Date   ADHD (attention deficit hyperactivity disorder)    Depression    Keratoconus    Tinnitus of right ear     Past Surgical History:  Procedure Laterality Date   EYE SURGERY Bilateral 11/10/2023   HERNIA REPAIR     WISDOM TOOTH EXTRACTION      Family History:  Family History  Problem Relation Age of Onset   Stroke Mother    Cancer Maternal Grandmother    Heart attack Maternal Grandfather    Epilepsy Maternal Grandfather     Social History:  Social History   Socioeconomic History   Marital status: Single    Spouse name: Not on file   Number of children: Not on file   Years of education: Not on file   Highest education level: Bachelor's degree (e.g., BA, AB, BS)  Occupational History   Not on file  Tobacco Use   Smoking status: Never   Smokeless tobacco: Never  Vaping Use   Vaping status: Never Used  Substance and Sexual Activity   Alcohol use: Yes   Drug use: Not Currently    Types: Marijuana   Sexual activity: Not on file  Other Topics Concern   Not on file  Social History Narrative   Not on file   Social Drivers of Health   Financial Resource Strain: Low Risk  (03/17/2024)   Overall  Financial Resource Strain (CARDIA)    Difficulty of Paying Living Expenses: Not very hard  Food Insecurity: No Food Insecurity (03/17/2024)   Hunger Vital Sign    Worried About Running Out of Food in the Last Year: Never true    Ran Out of Food in the Last Year: Never true  Transportation Needs: No Transportation Needs (03/17/2024)   PRAPARE - Administrator, Civil Service (Medical): No    Lack of Transportation (Non-Medical): No  Physical Activity: Insufficiently Active (03/17/2024)   Exercise Vital Sign    Days of Exercise per Week: 1 day    Minutes of Exercise per Session: 20 min  Stress: Stress Concern Present (03/17/2024)   [place]  Institute of Occupational Health - Occupational Stress Questionnaire    Feeling of Stress : To some extent  Social Connections: Socially Isolated (03/17/2024)   Social Connection and Isolation Panel [NHANES]    Frequency of Communication with Friends and Family: Never    Frequency of Social Gatherings with Friends and Family: Never    Attends Religious Services: 1 to 4 times per year    Active Member of [employer] or Organizations: No    Attends Engineer, structural: Not on file    Marital Status: Never married    Allergies: No Known Allergies  Current Medications: Current Outpatient Medications  Medication Sig Dispense Refill   buPROPion  (WELLBUTRIN  XL) 150 MG 24 hr tablet Take 1 tablet (150 mg total) by mouth daily. Take with 300 mg tab for a total of 450 mg 90 tablet 0   buPROPion  (WELLBUTRIN  XL) 300 MG 24 hr tablet Take 1 tablet (300 mg total) by mouth every morning. 90 tablet 0   [START ON 04/16/2024] methylphenidate  (RITALIN  LA) 10 MG 24 hr capsule Take 1 capsule (10 mg total) by mouth daily. 30 capsule 0   [START ON 05/17/2024] methylphenidate  (RITALIN  LA) 10 MG 24 hr capsule Take 1 capsule (10 mg total) by mouth daily. 30 capsule 0   [START ON 04/16/2024] methylphenidate  (RITALIN ) 5 MG tablet Take 1 tablet (5 mg total) by mouth daily  as needed. Take between noon and 2 pm 30 tablet 0   [START ON 05/17/2024] methylphenidate  (RITALIN ) 5 MG tablet Take 1 tablet (5 mg total) by mouth daily. Take between noon and 2 PM 30 tablet 0   No current facility-administered medications for this visit.     Psychiatric Specialty Exam: Review of Systems  There were no vitals taken for this visit.There is no height or weight on file to calculate BMI.  General Appearance: Well Groomed  Eye Contact:  Good  Speech:  Clear and Coherent  Volume:  Normal  Mood:  dysthimic  Affect:  Appropriate  Thought Process:  Coherent  Orientation:  Full (Time, Place, and Person)  Thought Content: Logical   Suicidal Thoughts:  No  Homicidal Thoughts:  No  Memory:  Immediate;   Good  Judgement:  Good  Insight:  Fair  Psychomotor Activity:  Normal  Concentration:  Concentration: Good  Recall:  Good  Fund of Knowledge: Good  Language: Good  Akathisia:  NA    AIMS (if indicated): not done  Assets:  Communication Skills Desire for Improvement Financial Resources/Insurance Housing Talents/Skills Transportation  ADL's:  Intact  Cognition: WNL  Sleep:  Good   Metabolic Disorder Labs: No results found for: "HGBA1C", "MPG" No results found for: "PROLACTIN" Lab Results  Component Value Date   CHOL 189 09/22/2023   TRIG 234 (H) 09/22/2023   HDL 38 (L) 09/22/2023   CHOLHDL 5.0 (H) 09/22/2023   LDLCALC 116 (H) 09/22/2023   LDLCALC 120 (H) 03/28/2023   Lab Results  Component Value Date   TSH 1.90 09/22/2023   TSH 0.84 03/28/2023    Therapeutic Level Labs: No results found for: "LITHIUM" No results found for: "VALPROATE" No results found for: "CBMZ"   Screenings: GAD-7    Flowsheet Row Office Visit from 10/17/2023 in [facility]  Total GAD-7 Score 4      PHQ2-9    Flowsheet Row Office Visit from 03/17/2024 in [facility] Office Visit from 10/17/2023  in [facility]  Video Visit from 06/30/2023 in [facility] Office Visit from 03/27/2023 in [facility]  PHQ-2 Total Score 0 1 0 2  PHQ-9 Total Score -- 5 0 7      Flowsheet Row Office Visit from 10/17/2023 in [facility]  C-SSRS RISK CATEGORY No Risk       Collaboration of Care: Collaboration of Care: Medication Management AEB medication prescription  Patient/Guardian was advised Release of Information must be obtained prior to any record release in order to collaborate their care with an outside provider. Patient/Guardian was advised if they have not already done so to contact the registration department to sign all necessary forms in order for us  to release information regarding their care.   Consent: Patient/Guardian gives verbal consent for treatment and assignment of benefits for services provided during this visit. Patient/Guardian expressed understanding and agreed to proceed.    Yves Herb, MD 03/18/2024, 2:18 PM   Virtual Visit via Video Note  I connected with Rick [name] on 03/18/24 at  2:00 PM EDT by a video enabled telemedicine application and verified that I am speaking with the correct person using two identifiers.  Location: Patient: Home Provider: Home Office   I discussed the limitations of evaluation and management by telemedicine and the availability of in person appointments. The patient expressed understanding and agreed to proceed.   I discussed the assessment and treatment plan with the patient. The patient was provided an opportunity to ask questions and all were answered. The patient agreed with the plan and demonstrated an understanding of the instructions.   The patient was advised to call back or seek an in-person evaluation if the symptoms worsen or if the condition fails to improve as  anticipated.  I provided 15 minutes of non-face-to-face time during this encounter.   Yves Herb, MD

## 2024-03-17 ENCOUNTER — Ambulatory Visit: Admitting: Family

## 2024-03-17 VITALS — BP 122/84 | HR 105 | Temp 96.8°F | Ht 71.5 in | Wt 152.4 lb

## 2024-03-17 DIAGNOSIS — L918 Other hypertrophic disorders of the skin: Secondary | ICD-10-CM | POA: Diagnosis not present

## 2024-03-17 DIAGNOSIS — R634 Abnormal weight loss: Secondary | ICD-10-CM

## 2024-03-17 DIAGNOSIS — J301 Allergic rhinitis due to pollen: Secondary | ICD-10-CM

## 2024-03-17 DIAGNOSIS — E782 Mixed hyperlipidemia: Secondary | ICD-10-CM | POA: Diagnosis not present

## 2024-03-17 DIAGNOSIS — F331 Major depressive disorder, recurrent, moderate: Secondary | ICD-10-CM | POA: Diagnosis not present

## 2024-03-17 DIAGNOSIS — F909 Attention-deficit hyperactivity disorder, unspecified type: Secondary | ICD-10-CM | POA: Diagnosis not present

## 2024-03-18 ENCOUNTER — Other Ambulatory Visit

## 2024-03-18 ENCOUNTER — Encounter (HOSPITAL_COMMUNITY): Payer: Self-pay | Admitting: Psychiatry

## 2024-03-18 ENCOUNTER — Telehealth (HOSPITAL_COMMUNITY): Admitting: Psychiatry

## 2024-03-18 DIAGNOSIS — F331 Major depressive disorder, recurrent, moderate: Secondary | ICD-10-CM

## 2024-03-18 DIAGNOSIS — F908 Attention-deficit hyperactivity disorder, other type: Secondary | ICD-10-CM

## 2024-03-18 MED ORDER — METHYLPHENIDATE HCL 5 MG PO TABS: 5.0000 mg | ORAL_TABLET | Freq: Every day | ORAL | 0 refills | Status: DC | PRN

## 2024-03-18 MED ORDER — METHYLPHENIDATE HCL 5 MG PO TABS: 5.0000 mg | ORAL_TABLET | Freq: Every day | ORAL | 0 refills | Status: DC

## 2024-03-18 MED ORDER — METHYLPHENIDATE HCL ER (LA) 10 MG PO CP24: 10.0000 mg | ORAL_CAPSULE | Freq: Every day | ORAL | 0 refills | Status: DC

## 2024-03-18 MED ORDER — BUPROPION HCL ER (XL) 150 MG PO TB24: 150.0000 mg | ORAL_TABLET | Freq: Every day | ORAL | 0 refills | Status: DC

## 2024-03-18 MED ORDER — BUPROPION HCL ER (XL) 300 MG PO TB24: 300.0000 mg | ORAL_TABLET | ORAL | 0 refills | Status: DC

## 2024-03-18 NOTE — Progress Notes (Signed)
 Provider: Christean Courts FNP-C   Herbert Marken, Elijio Guadeloupe, NP  Patient Care Team: Burech Mcfarland, Elijio Guadeloupe, NP as PCP - General (Family Medicine) Artemisa Lars, MD as Consulting Physician (Ophthalmology)  Extended Emergency Contact Information Primary Emergency Contact: [name],[name] Address: [address]          [place], [place] [address] [place] Mobile Phone: [phone] Relation: Mother Secondary Emergency Contact: [name],[name] Mobile Phone: [phone] Relation: Brother Preferred language: English Interpreter needed? No  Code Status:  Full Code  Goals of care: Advanced Directive information    08/07/2014    4:46 PM  Advanced Directives  Does Patient Have a Medical Advance Directive? No  Would patient like information on creating a medical advance directive? Yes - Lexicographer Complaint  Patient presents with   Follow-up    6 month followup Discuss getting a referral for skin tag that is on his back also he wants to discuss medication .     Discussed the use of AI scribe software for clinical note transcription with the patient, who gave verbal consent to proceed.  History of Present Illness   Rick [name] is a 33 year old male with ADHD and depression who presents for a six-month follow-up.  He is here for a follow-up visit, which was delayed beyond the usual six-month interval. His heart rate is elevated, which he attributes to his Ritalin  use. His weight has decreased from 165 pounds to 152 pounds since his last visit, and he acknowledges that Ritalin  affects his appetite, making it difficult to maintain weight. He is currently taking Ritalin , with a regimen of a 10 mg capsule and a 5 mg tablet, which is half the dose he was on a few years ago. He also takes Wellbutrin , which was recently increased to 300 mg. He follows up with a psychiatrist for the management of these medications. No nervousness despite experiencing shaking. Has  appointment with psychiatrist tomorrow.   He experiences seasonal allergies, with symptoms such as sneezing and throat itching, which have been exacerbated by the pollen this year. The severity of his symptoms depends on the pollen levels.  He mentions a skin tag on his back, which he can feel but not see, and he does not believe it has changed in size. He has not noticed any changes in color.  His last lab work showed high triglycerides and LDL cholesterol, and he is due for a recheck. He reports no issues with urination, though he notes that he needs to go more quickly sometimes. He believes he is drinking enough water. He has not received any new COVID vaccines recently.   Past Medical History:  Diagnosis Date   ADHD (attention deficit hyperactivity disorder)    Depression    Keratoconus    Tinnitus of right ear    Past Surgical History:  Procedure Laterality Date   EYE SURGERY Bilateral 11/10/2023   HERNIA REPAIR     WISDOM TOOTH EXTRACTION      No Known Allergies  Allergies as of 03/17/2024   No Known Allergies      Medication List        Accurate as of Mar 17, 2024 11:59 PM. If you have any questions, ask your nurse or doctor.          buPROPion  300 MG 24 hr tablet Commonly known as: Wellbutrin  XL Take 1 tablet (300 mg total) by mouth every morning. What changed: Another medication with the  same name was removed. Continue taking this medication, and follow the directions you see here. Changed by: Stiven Kaspar C Tenessa Marsee   methylphenidate  5 MG tablet Commonly known as: Ritalin  Take 1 tablet (5 mg total) by mouth daily. Take between noon and 2 PM   methylphenidate  10 MG 24 hr capsule Commonly known as: RITALIN  LA Take 1 capsule (10 mg total) by mouth daily.   methylphenidate  5 MG tablet Commonly known as: Ritalin  Take 1 tablet (5 mg total) by mouth daily as needed. Take between noon and 2 pm   methylphenidate  10 MG 24 hr capsule Commonly known as: RITALIN  LA Take  1 capsule (10 mg total) by mouth daily.        Review of Systems  Constitutional:  Negative for appetite change, chills, fatigue, fever and unexpected weight change.  HENT:  Negative for congestion, dental problem, ear discharge, ear pain, facial swelling, hearing loss, nosebleeds, postnasal drip, rhinorrhea, sinus pressure, sinus pain, sneezing, sore throat, tinnitus and trouble swallowing.   Eyes:  Negative for pain, discharge, redness, itching and visual disturbance.  Respiratory:  Negative for cough, chest tightness, shortness of breath and wheezing.   Cardiovascular:  Negative for chest pain, palpitations and leg swelling.  Gastrointestinal:  Negative for abdominal distention, abdominal pain, blood in stool, constipation, diarrhea, nausea and vomiting.  Endocrine: Negative for cold intolerance, heat intolerance, polydipsia, polyphagia and polyuria.  Genitourinary:  Negative for difficulty urinating, dysuria, flank pain, frequency and urgency.  Musculoskeletal:  Negative for arthralgias, back pain, gait problem, joint swelling, myalgias, neck pain and neck stiffness.  Skin:  Negative for color change, pallor, rash and wound.  Neurological:  Negative for dizziness, syncope, speech difficulty, weakness, light-headedness, numbness and headaches.  Hematological:  Does not bruise/bleed easily.  Psychiatric/Behavioral:  Negative for agitation, behavioral problems, confusion, hallucinations, self-injury, sleep disturbance and suicidal ideas. The patient is not nervous/anxious.        Depression and ADHD controlled     Immunization History  Administered Date(s) Administered   DTaP 10/11/1993, 12/05/1993, 02/14/1994, 09/26/1995, 06/29/1998   HIB (PRP-OMP) 10/11/1993, 12/05/1993, 02/14/1994, 12/01/1994   Hepatitis A 08/13/2005, 03/21/2006   Hepatitis B 11/07/1993, 03/17/1994   Hpv-Unspecified 12/12/2009, 02/12/2010, 06/19/2010   Influenza, Seasonal, Injecte, Preservative Fre 09/19/2023   MMR  12/01/1994, 08/09/1997   Meningococcal Mcv4o 08/13/2005, 02/08/2014   OPV 10/11/1993, 12/05/1993, 02/14/1994, 06/29/1998   Pneumococcal Polysaccharide-23 09/21/1997   Td 02/08/2014   Tdap 08/13/2005, 05/03/2021   Varicella 12/01/1994, 09/26/2007   Pertinent  Health Maintenance Due  Topic Date Due   INFLUENZA VACCINE  06/04/2024      06/30/2023    2:32 PM 03/17/2024    1:34 PM  Fall Risk  Falls in the past year? 0 0  Was there an injury with Fall? 0 0  Fall Risk Category Calculator 0 0  Patient at Risk for Falls Due to No Fall Risks No Fall Risks  Fall risk Follow up Falls evaluation completed Falls prevention discussed;Falls evaluation completed   Functional Status Survey:    Vitals:   03/17/24 1335  BP: 122/84  Pulse: (!) 105  Temp: (!) 96.8 F (36 C)  TempSrc: Temporal  SpO2: 97%  Weight: 152 lb 6.4 oz (69.1 kg)  Height: 5' 11.5" (1.816 m)   Body mass index is 20.96 kg/m. Physical Exam  VITALS: T- 96.8, BP- 122/84 MEASUREMENTS: Weight- 152. GENERAL: Alert, cooperative, well developed, no acute distress. HEENT: Normocephalic, normal oropharynx, moist mucous membranes, ears normal without infection, nose normal, no  sinus tenderness. CHEST: Clear to auscultation bilaterally, no wheezes, rhonchi, or crackles, no back tenderness. CARDIOVASCULAR: Tachycardia S1 and S2 normal without murmurs. ABDOMEN: Soft, non-tender, non-distended, without organomegaly, normal bowel sounds. EXTREMITIES: No cyanosis or edema. MUSCULOSKELETAL: No numbness or tingling in legs. NEUROLOGICAL: Cranial nerves grossly intact, moves all extremities without gross motor or sensory deficit. SKIN: Small mole, no color change.  SKIN: No rash,no lesion or erythema   PSYCHIATRY/BEHAVIORAL: Mood stable   Labs reviewed: Recent Labs    03/28/23 0847 09/22/23 0950  NA 141 139  K 4.3 4.1  CL 103 106  CO2 28 27  GLUCOSE 94 98  BUN 23 23  CREATININE 0.96 0.82  CALCIUM 9.9 9.8   Recent Labs     03/28/23 0847 09/22/23 0950  AST 18 16  ALT 27 25  BILITOT 0.7 0.4  PROT 7.4 7.1   Recent Labs    03/28/23 0847 09/22/23 0950  WBC 6.9 7.8  NEUTROABS 2,988 3,455  HGB 15.6 15.7  HCT 46.2 47.4  MCV 88.0 88.9  PLT 269 293   Lab Results  Component Value Date   TSH 1.90 09/22/2023   No results found for: "HGBA1C" Lab Results  Component Value Date   CHOL 189 09/22/2023   HDL 38 (L) 09/22/2023   LDLCALC 116 (H) 09/22/2023   TRIG 234 (H) 09/22/2023   CHOLHDL 5.0 (H) 09/22/2023    Significant Diagnostic Results in last 30 days:  No results found.  Assessment/Plan  Weight loss due to Ritalin  Weight decreased from 165 lbs to 152 lbs, likely due to Ritalin . Appetite remains good. - Monitor weight - Inform psychiatrist about weight loss  ADHD ADHD is well-controlled with Ritalin , which may cause increased heart rate and weight loss. - Continue current Ritalin  dosage  Depression Depression is well-controlled with Wellbutrin  300 mg. - Continue current Wellbutrin  dosage  Hyperlipidemia Previous labs showed elevated triglycerides and LDL. Re-evaluation of lipid profile is necessary. - Schedule lab work to recheck cholesterol levels  Allergic rhinitis Symptoms include sneezing and itching, likely exacerbated by seasonal pollen.  Skin tag Presence of a skin tag on the back with no changes in size or color. - Refer to dermatologist for evaluation   Family/ staff Communication: Reviewed plan of care with patient verbalized understanding   Labs/tests ordered:  - CBC with Differential/Platelet - CMP with eGFR([facility]) - TSH - Lipid panel  Next Appointment : Return in about 6 months (around 09/17/2024) for medical mangement of chronic issues., Fasting labs in the morning.   Spent 30 minutes of Face to face and non-face to face with patient  >50% time spent counseling; reviewing medical record; tests; labs; documentation and developing future plan of care.   Estil Heman, NP

## 2024-03-19 ENCOUNTER — Other Ambulatory Visit

## 2024-03-19 DIAGNOSIS — E782 Mixed hyperlipidemia: Secondary | ICD-10-CM | POA: Diagnosis not present

## 2024-03-19 DIAGNOSIS — F331 Major depressive disorder, recurrent, moderate: Secondary | ICD-10-CM | POA: Diagnosis not present

## 2024-03-19 DIAGNOSIS — F909 Attention-deficit hyperactivity disorder, unspecified type: Secondary | ICD-10-CM | POA: Diagnosis not present

## 2024-03-20 LAB — LIPID PANEL
Cholesterol: 183 mg/dL (ref <200)
HDL: 41 mg/dL (ref >=40)
LDL Cholesterol (Calc): 123 mg/dL — ABNORMAL HIGH
Non-HDL Cholesterol (Calc): 142 mg/dL — ABNORMAL HIGH (ref <130)
Total CHOL/HDL Ratio: 4.5 (calc) (ref <5.0)
Triglycerides: 88 mg/dL (ref <150)

## 2024-03-20 LAB — COMPLETE METABOLIC PANEL WITHOUT GFR
AG Ratio: 1.8 (calc) (ref 1.0–2.5)
ALT: 20 U/L (ref 9–46)
AST: 16 U/L (ref 10–40)
Albumin: 4.6 g/dL (ref 3.6–5.1)
Alkaline phosphatase (APISO): 54 U/L (ref 36–130)
BUN: 18 mg/dL (ref 7–25)
CO2: 29 mmol/L (ref 20–32)
Calcium: 9.7 mg/dL (ref 8.6–10.3)
Chloride: 104 mmol/L (ref 98–110)
Creat: 0.87 mg/dL (ref 0.60–1.26)
Globulin: 2.5 g/dL (ref 1.9–3.7)
Glucose, Bld: 89 mg/dL (ref 65–99)
Potassium: 3.9 mmol/L (ref 3.5–5.3)
Sodium: 140 mmol/L (ref 135–146)
Total Bilirubin: 0.7 mg/dL (ref 0.2–1.2)
Total Protein: 7.1 g/dL (ref 6.1–8.1)

## 2024-03-20 LAB — CBC WITH DIFFERENTIAL/PLATELET
Absolute Lymphocytes: 2784 {cells}/uL (ref 850–3900)
Absolute Monocytes: 601 {cells}/uL (ref 200–950)
Basophils Absolute: 19 {cells}/uL (ref 0–200)
Basophils Relative: 0.3 %
Eosinophils Absolute: 68 {cells}/uL (ref 15–500)
Eosinophils Relative: 1.1 %
HCT: 47.9 % (ref 38.5–50.0)
Hemoglobin: 15.6 g/dL (ref 13.2–17.1)
MCH: 29.5 pg (ref 27.0–33.0)
MCHC: 32.6 g/dL (ref 32.0–36.0)
MCV: 90.5 fL (ref 80.0–100.0)
MPV: 9.5 fL (ref 7.5–12.5)
Monocytes Relative: 9.7 %
Neutro Abs: 2728 {cells}/uL (ref 1500–7800)
Neutrophils Relative %: 44 %
Platelets: 249 10*3/uL (ref 140–400)
RBC: 5.29 10*6/uL (ref 4.20–5.80)
RDW: 13 % (ref 11.0–15.0)
Total Lymphocyte: 44.9 %
WBC: 6.2 10*3/uL (ref 3.8–10.8)

## 2024-03-20 LAB — TSH: TSH: 1.14 m[IU]/L (ref 0.40–4.50)

## 2024-03-22 ENCOUNTER — Ambulatory Visit: Payer: Self-pay | Admitting: Family

## 2024-03-22 ENCOUNTER — Ambulatory Visit (HOSPITAL_COMMUNITY): Admitting: Licensed Clinical Social Worker

## 2024-03-22 DIAGNOSIS — F331 Major depressive disorder, recurrent, moderate: Secondary | ICD-10-CM | POA: Diagnosis not present

## 2024-03-22 DIAGNOSIS — F908 Attention-deficit hyperactivity disorder, other type: Secondary | ICD-10-CM | POA: Diagnosis not present
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
## 2024-04-12 ENCOUNTER — Encounter (HOSPITAL_COMMUNITY): Payer: Self-pay

## 2024-04-12 ENCOUNTER — Ambulatory Visit (HOSPITAL_COMMUNITY): Admitting: Licensed Clinical Social Worker

## 2024-05-03 ENCOUNTER — Ambulatory Visit (INDEPENDENT_AMBULATORY_CARE_PROVIDER_SITE_OTHER): Admitting: Licensed Clinical Social Worker

## 2024-05-03 ENCOUNTER — Encounter (HOSPITAL_COMMUNITY): Payer: Self-pay

## 2024-05-03 DIAGNOSIS — F331 Major depressive disorder, recurrent, moderate: Secondary | ICD-10-CM | POA: Diagnosis not present

## 2024-05-03 DIAGNOSIS — F908 Attention-deficit hyperactivity disorder, other type: Secondary | ICD-10-CM

## 2024-05-03 NOTE — Progress Notes (Signed)
 THERAPIST PROGRESS NOTE   Session Date: 05/03/2024  Session Time: 1504 - 1401 Virtual Visit via Video Note  I connected with Rick [name] on 05/03/24 at  1:00 PM EDT by a video enabled telemedicine application and verified that I am speaking with the correct person using two identifiers.  Location: Patient: Home Provider: Home Office   I discussed the limitations of evaluation and management by telemedicine and the availability of in person appointments. The patient expressed understanding and agreed to proceed.  I discussed the assessment and treatment plan with the patient. The patient was provided an opportunity to ask questions and all were answered. The patient agreed with the plan and demonstrated an understanding of the instructions.   The patient was advised to call back or seek an in-person evaluation if the symptoms worsen or if the condition fails to improve as anticipated.  I provided 57 minutes of non-face-to-face time during this encounter.  Participation Level: Active  MSE/Presentation: Behavior: Appropriate and Sharing Speech: Normal Thought Process: Coherent Cognition: Alert and Appropriate Mood: Euthymic Affect: Congruent Insight: Improving Appearance: Casual  Type of Therapy: Individual Therapy  Treatment Goals addressed:   Initial (5) LTG: Reduce frequency, intensity, and duration of depression symptoms so that daily functioning is improved (OP Depression) LTG: Increase coping skills to manage depression and improve ability to perform daily activities (OP Depression) STG: Rick [name] will reduce frequency of avoidant behaviors by 50% as evidenced by self-report in therapy sessions (OP Depression) LTG: Rick [name] will reduce the amount of anger-related incidents/outbursts by 25% as evidenced by self-report (Anger Management) STG: Rick [name] will identify situations, thoughts, and feelings that trigger internal anger, and/or angry/aggressive actions as evidenced by  self-report (Anger Management)  Progress Towards Goals: Initial  Interventions: CBT, Motivational Interviewing, Solution Focused, and Supportive  Summary: Rick [name] is a 33 y.o. male with psych history of MDD and ADHD, presenting for follow-up therapy session in efforts to improve management of depressive symptoms.   Patient actively engaged in session, presenting in pleasant moods, and congruent affect throughout duration of visit. Patient openly engaged in introductory check-in, sharing of things having been busy over recent weeks, further detailing extensive travel out of state over the past six weeks with tending to mother's medical needs out of state. Pt detailed having brief opportunity to spend time with friends to relax and take care of self. Further explored supportive relationships with family pt has to aid in navigating mother and twin brother's needs, processing relationship with other brother whom has distanced self from pt's mother due to conflictual relationship and extensive medical needs of mother causing her to be dependent on others. Explored pt's thoughts, feelings, and perspectives surrounding being the sole individual providing support for mother's medical needs, processing implications this has on pt's moods and outlook surrounding individual life progressions. Engaged in reassessing of presenting depressive and anxious sxs experienced over past two weeks, noting of maintained minimal anxious sxs, determining no further need to reassess anxious sxs unless noting of increased experienced sxs in future appts. Reviewed depressive sxs screenings, noting of maintained mild scores, with variances in sxs observed over recent weeks, further reflecting on increased feeling down and depressed over recent weeks, associating observed sxs to be in relation to recent increased travel leading to greater fatigue and depressed moods. Actively engaged in exploration of individual thoughts and  perspectives surrounding presenting areas for work surrounding depressive sxs, identifying various tx goals applicable to the management of depressive and anger/irritability related sxs in  finalization of individualized tx plan.  Patient responded well to interventions. Patient continues to meet criteria for MDD and ADHD. Patient will continue to benefit from engagement in outpatient therapy due to being the least restrictive service to meet presenting needs.      05/03/2024    1:34 PM 03/22/2024    8:07 AM 10/17/2023    9:23 AM  GAD 7 : Generalized Anxiety Score  Nervous, Anxious, on Edge 1 1 1   Control/stop worrying 1 1 1   Worry too much - different things 1 1 0  Trouble relaxing 0 1 0  Restless 0 1 0  Easily annoyed or irritable 1 2 2   Afraid - awful might happen 0 0 0  Total GAD 7 Score 4 7 4   Anxiety Difficulty Somewhat difficult Somewhat difficult       05/03/2024    1:24 PM 03/22/2024    8:09 AM 03/17/2024    1:35 PM 10/17/2023    9:21 AM 06/30/2023    2:32 PM  Depression screen PHQ 2/9  Decreased Interest 1 1 0 1 0  Down, Depressed, Hopeless 1 0 0 0 0  PHQ - 2 Score 2 1 0 1 0  Altered sleeping 1 1  2  0  Tired, decreased energy 1 1  1  0  Change in appetite 1 1  0 0  Feeling bad or failure about yourself  0 1  1 0  Trouble concentrating 0 0  0 0  Moving slowly or fidgety/restless 0 0  0 0  Suicidal thoughts 0 0  0 0  PHQ-9 Score 5 5  5  0  Difficult doing work/chores Somewhat difficult Somewhat difficult      Flowsheet Row Office Visit from 10/17/2023 in [facility]  C-SSRS RISK CATEGORY No Risk    Suicidal/Homicidal: None, No plan to harm self or others  Therapist Response: Clinician utilized CBT, MI, Solution Focused, and supportive reflection interventions to support patient in efforts to address presenting sxs and challenges surrounding presenting stressors.  Clinician actively greeted pt upon presenting for today's visit,  engaging pt in introductory check-in, assessing presenting moods and affect, and prompting brief reflection of daily and recent events, and factors contributing to presentation. Actively listened to patient's reflections of morning events and presenting moods, further prompting patient's recounts of events of the past six weeks, utilizing open ended questions to further elicit pt's thoughts and perspectives in exploring recent challenges and stressors, and individual means of managing stressors by finding means of avoidance. Actively engaged pt in reassessing presenting depressive and anxious sxs, determining minimal anxious distress and able to d/c future administering of GAD-7 unless determined necessary with increased reports of sxs. Utilized socratic questions to further support pt in greater critical processing of presenting challenges, thoughts and perspectives surrounding stressors, individual abilities at managing such sxs, and desired progressions in aims of determining appropriate tx goals to guide the future course of tx.  Clinician reassessed severity of presenting sxs, and presence of any safety concerns. Clinician provided support and empathy to patient during session.  Homework: None.  Plan: Return again in 3 weeks.  Diagnosis:  Encounter Diagnoses  Name Primary?   Moderate episode of recurrent major depressive disorder (HCC) Yes   Attention-deficit hyperactivity disorder, other type     Collaboration of Care: Psychiatrist AEB provider documentation available in EHR.  Patient/Guardian was advised Release of Information must be obtained prior to any record release in order to collaborate their care with  an outside provider. Patient/Guardian was advised if they have not already done so to contact the registration department to sign all necessary forms in order for us  to release information regarding their care.   Consent: Patient/Guardian gives verbal consent for treatment and  assignment of benefits for services provided during this visit. Patient/Guardian expressed understanding and agreed to proceed.   Lynwood JONETTA Maris, MSW, LCSW 05/03/2024,  1:37 PM

## 2024-05-24 ENCOUNTER — Ambulatory Visit (HOSPITAL_COMMUNITY): Admitting: Licensed Clinical Social Worker

## 2024-05-24 ENCOUNTER — Encounter (HOSPITAL_COMMUNITY): Payer: Self-pay

## 2024-05-26 ENCOUNTER — Telehealth (HOSPITAL_COMMUNITY): Admitting: Licensed Clinical Social Worker

## 2024-05-26 DIAGNOSIS — F331 Major depressive disorder, recurrent, moderate: Secondary | ICD-10-CM | POA: Diagnosis not present

## 2024-05-26 DIAGNOSIS — F908 Attention-deficit hyperactivity disorder, other type: Secondary | ICD-10-CM

## 2024-05-26 NOTE — Progress Notes (Unsigned)
 THERAPIST PROGRESS NOTE   Session Date: 05/26/2024  Session Time: 1005 - 1112 Virtual Visit via Video Note  I connected with Rick [name] on 05/26/24 at 10:00 AM EDT by a video enabled telemedicine application and verified that I am speaking with the correct person using two identifiers.  Location: Patient: Home Provider: Home Office   I discussed the limitations of evaluation and management by telemedicine and the availability of in person appointments. The patient expressed understanding and agreed to proceed.  I discussed the assessment and treatment plan with the patient. The patient was provided an opportunity to ask questions and all were answered. The patient agreed with the plan and demonstrated an understanding of the instructions.   The patient was advised to call back or seek an in-person evaluation if the symptoms worsen or if the condition fails to improve as anticipated.  I provided 67 minutes of non-face-to-face time during this encounter.  Participation Level: Active  MSE/Presentation: Behavior: Appropriate and Sharing Speech: Normal Thought Process: Coherent Cognition: Alert and Appropriate Mood: Euthymic Affect: Congruent Insight: Improving Appearance: Casual  Type of Therapy: Individual Therapy  Treatment Goals addressed:   Initial (5) LTG: Reduce frequency, intensity, and duration of depression symptoms so that daily functioning is improved (OP Depression) LTG: Increase coping skills to manage depression and improve ability to perform daily activities (OP Depression) STG: Rick [name] will reduce frequency of avoidant behaviors by 50% as evidenced by self-report in therapy sessions (OP Depression) LTG: Rick [name] will reduce the amount of anger-related incidents/outbursts by 25% as evidenced by self-report (Anger Management) STG: Rick [name] will identify situations, thoughts, and feelings that trigger internal anger, and/or angry/aggressive actions as evidenced by  self-report (Anger Management)  Progress Towards Goals: Progressing  Interventions: CBT, Motivational Interviewing, Solution Focused, and Supportive  Summary: Rick [name] is a 33 y.o. male with psych history of MDD and ADHD, presenting for follow-up therapy session in efforts to improve management of depressive symptoms.   Patient actively engaged in session, presenting in pleasant moods, and congruent affect throughout duration of visit. Patient openly engaged in introductory check-in, sharing of Things being pretty good these last couple weeks, further detailing of having planned to go to [place] for mother's medical needs having been postponed, feeling to add a little stress due to not liking postponing things. Pt detailed further stress of AC having gone out earlier this week, proving to increase stress. Pt further reflected on having enjoyed trip to [facility], [place] over July 4th, finding self experiencing increased depressive sxs upon return, not wanting to get out of bed, feeling down for a couple days, more irritable. Processed experienced thoughts and feelings surrounding returning home, and having to return to regular routine and responsibilities, not wanting to focus on at home duties, playing video games for 8hrs off and on one day as a means escapism. Explored how this proved to impact interactions with household members, finding self wanting to avoid mother and brother, and their presenting needs. Extensively explored interactions with brother, brother's own presenting needs, and possible benefits of Vocational Rehab services for brother to support with presenting needs. Briefly explored experience with anger and irritability, and how this proves to impact mood, noting of being in a negative mood for the day, tending to ruminate and internalize frustrations, proving to let things go later in the day.   Patient responded well to interventions. Patient continues to meet criteria for MDD  and ADHD. Patient will continue to benefit from engagement in outpatient  therapy due to being the least restrictive service to meet presenting needs.      05/26/2024   10:16 AM 05/03/2024    1:34 PM 03/22/2024    8:07 AM 10/17/2023    9:23 AM  GAD 7 : Generalized Anxiety Score  Nervous, Anxious, on Edge 1 1 1 1   Control/stop worrying 0 1 1 1   Worry too much - different things 1 1 1  0  Trouble relaxing 1 0 1 0  Restless 0 0 1 0  Easily annoyed or irritable 1 1 2 2   Afraid - awful might happen 0 0 0 0  Total GAD 7 Score 4 4 7 4   Anxiety Difficulty Somewhat difficult Somewhat difficult Somewhat difficult       05/26/2024   10:21 AM 05/03/2024    1:24 PM 03/22/2024    8:09 AM 03/17/2024    1:35 PM 10/17/2023    9:21 AM  Depression screen PHQ 2/9  Decreased Interest 1 1 1  0 1  Down, Depressed, Hopeless 1 1 0 0 0  PHQ - 2 Score 2 2 1  0 1  Altered sleeping 1 1 1  2   Tired, decreased energy 1 1 1  1   Change in appetite 0 1 1  0  Feeling bad or failure about yourself  0 0 1  1  Trouble concentrating 1 0 0  0  Moving slowly or fidgety/restless 0 0 0  0  Suicidal thoughts 0 0 0  0  PHQ-9 Score 5 5 5  5   Difficult doing work/chores Somewhat difficult Somewhat difficult Somewhat difficult     Flowsheet Row Office Visit from 10/17/2023 in [facility]  C-SSRS RISK CATEGORY No Risk    Suicidal/Homicidal: None, No plan to harm self or others  Therapist Response: Clinician utilized CBT, MI, Solution Focused, and supportive reflection interventions to support patient in efforts to address presenting sxs and challenges surrounding presenting stressors.  Clinician *** actively greeted pt upon presenting for today's virtual visit, assessing presenting moods and affect, engaging pt in introductory check-in. *** assessing presenting moods and affect, and prompting brief reflection of daily and recent events, and factors contributing to presentation. Actively  listened to patient's reflections of morning events and presenting moods, further prompting patient's recounts of events of the past six weeks, utilizing open ended questions to further elicit pt's thoughts and perspectives in exploring recent challenges and stressors, and individual means of managing stressors by finding means of avoidance. Actively engaged pt in reassessing presenting depressive and anxious sxs, determining minimal anxious distress and able to d/c future administering of GAD-7 unless determined necessary with increased reports of sxs. Utilized socratic questions to further support pt in greater critical processing of presenting challenges, thoughts and perspectives surrounding stressors, individual abilities at managing such sxs, and desired progressions in aims of determining appropriate tx goals to guide the future course of tx.  Clinician reassessed severity of presenting sxs, and presence of any safety concerns. Clinician provided support and empathy to patient during session.  Homework: Spend time exploring things that aid in relaxation, bringing peace, and decompress, tracking   Plan: Return again in 3 weeks.  Diagnosis:  Encounter Diagnoses  Name Primary?   Moderate episode of recurrent major depressive disorder (HCC) Yes   Attention-deficit hyperactivity disorder, other type      Collaboration of Care: Psychiatrist AEB provider documentation available in EHR.  Patient/Guardian was advised Release of Information must be obtained prior to any record  release in order to collaborate their care with an outside provider. Patient/Guardian was advised if they have not already done so to contact the registration department to sign all necessary forms in order for us  to release information regarding their care.   Consent: Patient/Guardian gives verbal consent for treatment and assignment of benefits for services provided during this visit. Patient/Guardian expressed  understanding and agreed to proceed.   Lynwood JONETTA Maris, MSW, LCSW 05/26/2024,  10:25 AM

## 2024-06-09 ENCOUNTER — Telehealth (HOSPITAL_COMMUNITY): Payer: Self-pay

## 2024-06-09 NOTE — Telephone Encounter (Signed)
 Patient needs refill on all medication, he is scheduled to come in next week. Please review and advise, thank you

## 2024-06-09 NOTE — Telephone Encounter (Signed)
 I called the pharmacy and he had not picked up the Adderall rx that was dated for July. They are going to fil that. I called patient and let him know, he has enough of the Wellbutrin  to get to his appointment

## 2024-06-10 ENCOUNTER — Telehealth (HOSPITAL_COMMUNITY): Admitting: Psychiatry

## 2024-06-10 ENCOUNTER — Telehealth (HOSPITAL_COMMUNITY): Payer: Self-pay

## 2024-06-10 DIAGNOSIS — F908 Attention-deficit hyperactivity disorder, other type: Secondary | ICD-10-CM

## 2024-06-10 MED ORDER — METHYLPHENIDATE HCL ER (LA) 10 MG PO CP24: 10.0000 mg | ORAL_CAPSULE | Freq: Every day | ORAL | 0 refills | Status: DC

## 2024-06-10 NOTE — Telephone Encounter (Signed)
 Patient has a prescription at his pharmacy for Ritalin  LA 10 mg, the pharmacy called and they do not have it in stock, can you please resend to [facility] on [address]? Please review and advise, thank you

## 2024-06-10 NOTE — Telephone Encounter (Signed)
Refill sent to the updated pharmacy

## 2024-06-14 NOTE — Progress Notes (Signed)
 BH MD/PA/NP OP Progress Note  06/17/2024 2:15 PM Rick [name]  MRN:  [id number]  Visit Diagnosis:    ICD-10-CM   1. Attention-deficit hyperactivity disorder, other type  F90.8 buPROPion  (WELLBUTRIN  XL) 300 MG 24 hr tablet    buPROPion  (WELLBUTRIN  XL) 150 MG 24 hr tablet    methylphenidate  (RITALIN  LA) 10 MG 24 hr capsule    methylphenidate  (RITALIN ) 5 MG tablet    methylphenidate  (RITALIN ) 5 MG tablet    methylphenidate  (RITALIN  LA) 10 MG 24 hr capsule    methylphenidate  (RITALIN ) 5 MG tablet    2. Moderate episode of recurrent major depressive disorder (HCC)  F33.1 buPROPion  (WELLBUTRIN  XL) 300 MG 24 hr tablet    buPROPion  (WELLBUTRIN  XL) 150 MG 24 hr tablet     Assessment: Rick [name] is a 33 y.o. male with a history of  MDD and ADHD who presented to [facility] at [facility] for initial evaluation on 10/17/2023.  At initial evaluation patient reported a history of depression that has been well controlled.  Patient reported symptoms began around 2009 at which time he endorsed low mood, increased isolation, negative self thoughts, feelings of worthlessness, and passive SI.  He denied ever experiencing any active SI.  Patient also was diagnosed with ADD at that time reporting having completed neuropsych testing and experiencing poor attention and concentration.  This has also been stable on medication for a number of years prior to initial evaluation.  Patient met criteria for MDD currently in remission and ADHD on initial evaluation.  Rick [name] presents for follow-up evaluation. Today, 06/17/24, patient reports that mood, anxiety, and ADHD symptoms are stable.  He is taking his medication consistently reporting he has some increased frequency of bowel movements being the only remaining adverse side effect.  Encouraged patient to remain consistent with his medication and date with food.  If this is still ongoing at next visit however there is limited  expectation of symptoms improving much further.  Patient feels positives outweigh the negatives and would like to continue on current regimen.  We will follow up in 3 months.  Plan: - Continue Wellbutrin  xl 450 mg daily - Continue Ritalin  LA 10 mg daily  - Continue Ritalin  IR 5 mg Q12-2pm daily - CBC, CMP, lipid profile, vitamin D , TSH, and U-Tox reviewed - Neuropsych testing, reviewed and confirmed ADHD diagnosis - Continue therapy with [facility] - Crisis resources reviewed - Follow up in 3 months  Chief Complaint:  Chief Complaint  Patient presents with   Follow-up   HPI: Rick [name] presents reporting that he is doing alright. He just got back from [place] where he was with his mom for her medical issues. While there is hope her treatments are wrapping up there has been some question on whether she may need more hip surgery.  Outside of this Rick [name] reports not much has been going on.  The ADHD has been well controlled on his medication regimen other than an instance where he had difficulty getting it from the pharmacy.  It has been about 7 days and he is picking up the long-acting prescription later today.  Rick [name] has noticed some increased frequency of bowel movements that occur roughly a couple hours after taking the medicine.  Discussed this as taking the medication with food.  Given that this has been ongoing for the past 8 months there is a chance that improvement will be limited.  That said part of the reoccurrence could be related to  patient missing doses when the pharmacy does not fill it.  For now him feels that the positives outweigh any negatives.  Past Psychiatric History: Had seen a psychiatrist in 2009 who dx him with ADD and depression. His PCP have managed his medications since. He saw them for a few visits. He then saw a therapist for 3 years followed by one off and on for 5 years throughout college.  Patient denies any prior psychiatric hospitalizations or past suicide  attempts.  Patient has taken Ritalin  and Xanax in the past for sleep  Has a drink every 1-2 weeks. Tried marijuana in college gave him anxiety. Occasional coffee.    Past Medical History:  Past Medical History:  Diagnosis Date   ADHD (attention deficit hyperactivity disorder)    Depression    Keratoconus    Tinnitus of right ear     Past Surgical History:  Procedure Laterality Date   EYE SURGERY Bilateral 11/10/2023   HERNIA REPAIR     WISDOM TOOTH EXTRACTION      Family History:  Family History  Problem Relation Age of Onset   Stroke Mother    Cancer Maternal Grandmother    Heart attack Maternal Grandfather    Epilepsy Maternal Grandfather     Social History:  Social History   Socioeconomic History   Marital status: Single    Spouse name: Not on file   Number of children: Not on file   Years of education: Not on file   Highest education level: Bachelor's degree (e.g., BA, AB, BS)  Occupational History   Not on file  Tobacco Use   Smoking status: Never   Smokeless tobacco: Never  Vaping Use   Vaping status: Never Used  Substance and Sexual Activity   Alcohol use: Yes   Drug use: Not Currently    Types: Marijuana   Sexual activity: Not on file  Other Topics Concern   Not on file  Social History Narrative   Not on file   Social Drivers of Health   Financial Resource Strain: Low Risk  (03/17/2024)   Overall Financial Resource Strain (CARDIA)    Difficulty of Paying Living Expenses: Not very hard  Food Insecurity: No Food Insecurity (03/17/2024)   Hunger Vital Sign    Worried About Running Out of Food in the Last Year: Never true    Ran Out of Food in the Last Year: Never true  Transportation Needs: No Transportation Needs (03/17/2024)   PRAPARE - Administrator, Civil Service (Medical): No    Lack of Transportation (Non-Medical): No  Physical Activity: Insufficiently Active (03/17/2024)   Exercise Vital Sign    Days of Exercise per Week: 1  day    Minutes of Exercise per Session: 20 min  Stress: Stress Concern Present (03/17/2024)   [employer] of Occupational Health - Occupational Stress Questionnaire    Feeling of Stress : To some extent  Social Connections: Socially Isolated (03/17/2024)   Social Connection and Isolation Panel    Frequency of Communication with Friends and Family: Never    Frequency of Social Gatherings with Friends and Family: Never    Attends Religious Services: 1 to 4 times per year    Active Member of [employer] or Organizations: No    Attends Engineer, structural: Not on file    Marital Status: Never married    Allergies: No Known Allergies  Current Medications: Current Outpatient Medications  Medication Sig Dispense Refill   buPROPion  (WELLBUTRIN   XL) 150 MG 24 hr tablet Take 1 tablet (150 mg total) by mouth daily. Take with 300 mg tab for a total of 450 mg 90 tablet 0   buPROPion  (WELLBUTRIN  XL) 300 MG 24 hr tablet Take 1 tablet (300 mg total) by mouth every morning. 90 tablet 0   methylphenidate  (RITALIN  LA) 10 MG 24 hr capsule Take 1 capsule (10 mg total) by mouth daily. 30 capsule 0   [START ON 07/08/2024] methylphenidate  (RITALIN  LA) 10 MG 24 hr capsule Take 1 capsule (10 mg total) by mouth daily. 30 capsule 0   [START ON 08/06/2024] methylphenidate  (RITALIN  LA) 10 MG 24 hr capsule Take 1 capsule (10 mg total) by mouth daily. 30 capsule 0   [START ON 07/08/2024] methylphenidate  (RITALIN ) 5 MG tablet Take 1 tablet (5 mg total) by mouth daily as needed. Take between noon and 2 pm 30 tablet 0   [START ON 08/06/2024] methylphenidate  (RITALIN ) 5 MG tablet Take 1 tablet (5 mg total) by mouth daily. Take between noon and 2 PM 30 tablet 0   [START ON 09/07/2024] methylphenidate  (RITALIN ) 5 MG tablet Take 1 tablet (5 mg total) by mouth daily. Take between noon and 2 PM 30 tablet 0   No current facility-administered medications for this visit.     Psychiatric Specialty Exam: Review of Systems   There were no vitals taken for this visit.There is no height or weight on file to calculate BMI.  General Appearance: Well Groomed  Eye Contact:  Good  Speech:  Clear and Coherent  Volume:  Normal  Mood:  Euthymic  Affect:  Appropriate  Thought Process:  Coherent  Orientation:  Full (Time, Place, and Person)  Thought Content: Logical   Suicidal Thoughts:  No  Homicidal Thoughts:  No  Memory:  Immediate;   Good  Judgement:  Good  Insight:  Fair  Psychomotor Activity:  Normal  Concentration:  Concentration: Good  Recall:  Good  Fund of Knowledge: Good  Language: Good  Akathisia:  NA    AIMS (if indicated): not done  Assets:  Communication Skills Desire for Improvement Financial Resources/Insurance Housing Talents/Skills Transportation  ADL's:  Intact  Cognition: WNL  Sleep:  Good   Metabolic Disorder Labs: No results found for: HGBA1C, MPG No results found for: PROLACTIN Lab Results  Component Value Date   CHOL 183 03/19/2024   TRIG 88 03/19/2024   HDL 41 03/19/2024   CHOLHDL 4.5 03/19/2024   LDLCALC 123 (H) 03/19/2024   LDLCALC 116 (H) 09/22/2023   Lab Results  Component Value Date   TSH 1.14 03/19/2024   TSH 1.90 09/22/2023    Therapeutic Level Labs: No results found for: LITHIUM No results found for: VALPROATE No results found for: CBMZ   Screenings: GAD-7    Flowsheet Row Video Visit from 05/26/2024 in [facility] at [place] Counselor from 05/03/2024 in [facility] at [place] Counselor from 03/22/2024 in [facility] at [facility] Visit [facility]  Total GAD-7 Score 4 4 7 4    PHQ2-9    Flowsheet Row Video Visit from 05/26/2024 in [facility] from 05/03/2024 in [facility] at [place] Counselor from  03/22/2024 in [facility] Visit from 03/17/2024 in [facility] Visit from 10/17/2023 in [facility]  PHQ-2  Total Score 2 2 1  0 1  PHQ-9 Total Score 5 5 5  -- 5   Flowsheet Row Office Visit from 10/17/2023 in [facility]  C-SSRS RISK CATEGORY No Risk    Collaboration of Care: Collaboration of Care: Medication Management AEB medication prescription  Patient/Guardian was advised Release of Information must be obtained prior to any record release in order to collaborate their care with an outside provider. Patient/Guardian was advised if they have not already done so to contact the registration department to sign all necessary forms in order for us  to release information regarding their care.   Consent: Patient/Guardian gives verbal consent for treatment and assignment of benefits for services provided during this visit. Patient/Guardian expressed understanding and agreed to proceed.    Arvella CHRISTELLA Finder, MD 06/17/2024, 2:15 PM   Virtual Visit via Video Note  I connected with Maude Burr on 06/17/24 at  2:00 PM EDT by a video enabled telemedicine application and verified that I am speaking with the correct person using two identifiers.  Location: Patient: Home Provider: Home Office   I discussed the limitations of evaluation and management by telemedicine and the availability of in person appointments. The patient expressed understanding and agreed to proceed.   I discussed the assessment and treatment plan with the patient. The patient was provided an opportunity to ask questions and all were answered. The patient agreed with the plan and demonstrated an understanding of the instructions.   The patient was advised to call back or seek an in-person evaluation if the symptoms worsen or if the condition fails to improve as  anticipated.  I provided 15 minutes of non-face-to-face time during this encounter.   Arvella CHRISTELLA Finder, MD

## 2024-06-17 ENCOUNTER — Encounter (HOSPITAL_COMMUNITY): Payer: Self-pay | Admitting: Psychiatry

## 2024-06-17 ENCOUNTER — Telehealth (HOSPITAL_BASED_OUTPATIENT_CLINIC_OR_DEPARTMENT_OTHER): Admitting: Psychiatry

## 2024-06-17 DIAGNOSIS — F331 Major depressive disorder, recurrent, moderate: Secondary | ICD-10-CM | POA: Diagnosis not present

## 2024-06-17 DIAGNOSIS — F908 Attention-deficit hyperactivity disorder, other type: Secondary | ICD-10-CM | POA: Diagnosis not present

## 2024-06-17 MED ORDER — METHYLPHENIDATE HCL ER (LA) 10 MG PO CP24: 10.0000 mg | ORAL_CAPSULE | Freq: Every day | ORAL | 0 refills | Status: DC

## 2024-06-17 MED ORDER — METHYLPHENIDATE HCL ER (LA) 10 MG PO CP24
10.0000 mg | ORAL_CAPSULE | Freq: Every day | ORAL | 0 refills | Status: DC
Start: 2024-08-06 — End: 2024-09-10

## 2024-06-17 MED ORDER — BUPROPION HCL ER (XL) 300 MG PO TB24: 300.0000 mg | ORAL_TABLET | ORAL | 0 refills | Status: DC

## 2024-06-17 MED ORDER — METHYLPHENIDATE HCL 5 MG PO TABS: 5.0000 mg | ORAL_TABLET | Freq: Every day | ORAL | 0 refills | Status: DC | PRN

## 2024-06-17 MED ORDER — METHYLPHENIDATE HCL 5 MG PO TABS: 5.0000 mg | ORAL_TABLET | Freq: Every day | ORAL | 0 refills | Status: DC

## 2024-06-17 MED ORDER — BUPROPION HCL ER (XL) 150 MG PO TB24: 150.0000 mg | ORAL_TABLET | Freq: Every day | ORAL | 0 refills | Status: DC

## 2024-06-25 DIAGNOSIS — H1789 Other corneal scars and opacities: Secondary | ICD-10-CM | POA: Diagnosis not present

## 2024-06-25 DIAGNOSIS — H18451 Nodular corneal degeneration, right eye: Secondary | ICD-10-CM | POA: Diagnosis not present

## 2024-06-25 DIAGNOSIS — H18603 Keratoconus, unspecified, bilateral: Secondary | ICD-10-CM | POA: Diagnosis not present

## 2024-06-25 DIAGNOSIS — H18613 Keratoconus, stable, bilateral: Secondary | ICD-10-CM | POA: Diagnosis not present

## 2024-06-28 ENCOUNTER — Ambulatory Visit (HOSPITAL_COMMUNITY): Admitting: Licensed Clinical Social Worker

## 2024-06-28 ENCOUNTER — Encounter (HOSPITAL_COMMUNITY): Payer: Self-pay

## 2024-06-29 ENCOUNTER — Ambulatory Visit (INDEPENDENT_AMBULATORY_CARE_PROVIDER_SITE_OTHER): Admitting: Licensed Clinical Social Worker

## 2024-06-29 DIAGNOSIS — F908 Attention-deficit hyperactivity disorder, other type: Secondary | ICD-10-CM | POA: Diagnosis not present

## 2024-06-29 DIAGNOSIS — F331 Major depressive disorder, recurrent, moderate: Secondary | ICD-10-CM | POA: Diagnosis not present

## 2024-06-29 NOTE — Progress Notes (Unsigned)
 THERAPIST PROGRESS NOTE   Session Date: 06/29/2024  Session Time: 1400 - 1451 Virtual Visit via Video Note  I connected with Rick [name] on 06/29/24 at  2:00 PM EDT by a video enabled telemedicine application and verified that I am speaking with the correct person using two identifiers.  Location: Patient: Home Provider: Home Office   I discussed the limitations of evaluation and management by telemedicine and the availability of in person appointments. The patient expressed understanding and agreed to proceed.  I discussed the assessment and treatment plan with the patient. The patient was provided an opportunity to ask questions and all were answered. The patient agreed with the plan and demonstrated an understanding of the instructions.   The patient was advised to call back or seek an in-person evaluation if the symptoms worsen or if the condition fails to improve as anticipated.  I provided 51 minutes of non-face-to-face time during this encounter.  Participation Level: Active  MSE/Presentation: Behavior: Appropriate and Sharing Speech: Normal Thought Process: Coherent Cognition: Alert and Appropriate Mood: Euthymic Affect: Congruent Insight: Improving Appearance: Casual  Type of Therapy: Individual Therapy  Treatment Goals addressed:   Initial (5) LTG: Reduce frequency, intensity, and duration of depression symptoms so that daily functioning is improved (OP Depression) LTG: Increase coping skills to manage depression and improve ability to perform daily activities (OP Depression) STG: [place] will reduce frequency of avoidant behaviors by 50% as evidenced by self-report in therapy sessions (OP Depression) LTG: [place] will reduce the amount of anger-related incidents/outbursts by 25% as evidenced by self-report (Anger Management) STG: [place] will identify situations, thoughts, and feelings that trigger internal anger, and/or angry/aggressive actions as evidenced by  self-report (Anger Management)  Progress Towards Goals: Progressing  Interventions: CBT, Motivational Interviewing, Solution Focused, and Supportive  Summary: Rick [name] is a 33 y.o. male with psych history of MDD and ADHD, presenting for follow-up therapy session in efforts to improve management of depressive symptoms.   Patient actively engaged in session, presenting in pleasant moods, and congruent affect throughout duration of visit. Patient openly engaged in introductory check-in, sharing of It's been alright, further detailing of having gone to [place] for mother's medical needs and awaiting results of MRI, and individual medical needs approaching next week. Pt shared of additional events of the past month, having spent time with peers for friends birthday, processing recent increased efforts to spend time with friends more over the last 6-8 weeks, in comparison to increased isolation in earlier months of the year due to mothers medical needs, and intent to maintain efforts at doing so. Reviewed recent med man visit and continued monitoring of GI concerns surrounding medication and possible interest in changes in the future. Reassessed presenting anxious and depressive sxs via GAD-7 and PHQ-9, processing minimal variances in sxs. Pt further noted of no significant challenges over the past month, and attempting to remain conscious of other's feelings, perspectives and moods. Reviewed pt's efforts at engaging in journaling practices to aid in reflection and processing of moods, factors contributing to moods, and individual abilities in managing stressors.  Patient responded well to interventions. Patient continues to meet criteria for MDD and ADHD. Patient will continue to benefit from engagement in outpatient therapy due to being the least restrictive service to meet presenting needs.      06/29/2024    2:24 PM 05/26/2024   10:16 AM 05/03/2024    1:34 PM 03/22/2024    8:07 AM  GAD 7 :  Generalized Anxiety  Score  Nervous, Anxious, on Edge 1 1 1 1   Control/stop worrying 0 0 1 1  Worry too much - different things 1 1 1 1   Trouble relaxing 0 1 0 1  Restless 1 0 0 1  Easily annoyed or irritable 1 1 1 2   Afraid - awful might happen 0 0 0 0  Total GAD 7 Score 4 4 4 7   Anxiety Difficulty Somewhat difficult Somewhat difficult Somewhat difficult Somewhat difficult      06/29/2024    2:28 PM 05/26/2024   10:21 AM 05/03/2024    1:24 PM 03/22/2024    8:09 AM 03/17/2024    1:35 PM  Depression screen PHQ 2/9  Decreased Interest 1 1 1 1  0  Down, Depressed, Hopeless 1 1 1  0 0  PHQ - 2 Score 2 2 2 1  0  Altered sleeping 1 1 1 1    Tired, decreased energy 1 1 1 1    Change in appetite 0 0 1 1   Feeling bad or failure about yourself  1 0 0 1   Trouble concentrating 1 1 0 0   Moving slowly or fidgety/restless 1 0 0 0   Suicidal thoughts 0 0 0 0   PHQ-9 Score 7 5 5 5    Difficult doing work/chores Somewhat difficult Somewhat difficult Somewhat difficult Somewhat difficult      Suicidal/Homicidal: None, No plan to harm self or others  Therapist Response: Clinician utilized CBT, MI, Solution Focused, and supportive reflection interventions to support patient in efforts to address presenting sxs and challenges surrounding presenting stressors.  Clinician  actively greeted pt upon presenting for today's virtual visit, assessing presenting moods and affect. Actively engaging pt in introductory check-in, utilizing open ended questions to evoke details of recent events, recent challenges, factors contributing to presenting moods, and individual efforts at managing stressors. Actively listened to pt's recounts of navigating mother's medical needs and his own, as well as reflection of interactions with peers. Utilized socratic questioning to aid pt in processing thoughts and feelings surrounding events and increased individual efforts at managing stressors and increasing socialization. Explored  pt's efforts at journaling and barriers to doing so, processing individual noted benefits when doing so, in reflection on, and processing moods.  Clinician reassessed severity of presenting sxs, and presence of any safety concerns. Clinician provided support and empathy to patient during session.  Homework: Increase efforts at regularly journaling practice as means of tracking moods, factors contributing to moods, and individual exploration of activities and interests that prove to improve moods an bring pt joy.   Plan: Return again in 3 weeks.  Diagnosis:  Encounter Diagnoses  Name Primary?   Attention-deficit hyperactivity disorder, other type Yes   Moderate episode of recurrent major depressive disorder (HCC)     Collaboration of Care: Psychiatrist AEB provider documentation available in EHR.  Patient/Guardian was advised Release of Information must be obtained prior to any record release in order to collaborate their care with an outside provider. Patient/Guardian was advised if they have not already done so to contact the registration department to sign all necessary forms in order for us  to release information regarding their care.   Consent: Patient/Guardian gives verbal consent for treatment and assignment of benefits for services provided during this visit. Patient/Guardian expressed understanding and agreed to proceed.   [place] JONETTA Maris, MSW, LCSW 06/29/2024,  2:32 PM

## 2024-07-09 DIAGNOSIS — H18791 Other corneal deformities, right eye: Secondary | ICD-10-CM | POA: Diagnosis not present

## 2024-07-09 DIAGNOSIS — H04121 Dry eye syndrome of right lacrimal gland: Secondary | ICD-10-CM | POA: Diagnosis not present

## 2024-07-09 DIAGNOSIS — H18421 Band keratopathy, right eye: Secondary | ICD-10-CM | POA: Diagnosis not present

## 2024-07-12 ENCOUNTER — Ambulatory Visit (HOSPITAL_COMMUNITY): Admitting: Licensed Clinical Social Worker

## 2024-07-19 ENCOUNTER — Ambulatory Visit (INDEPENDENT_AMBULATORY_CARE_PROVIDER_SITE_OTHER): Admitting: Licensed Clinical Social Worker

## 2024-07-19 ENCOUNTER — Encounter (HOSPITAL_COMMUNITY): Payer: Self-pay

## 2024-07-19 DIAGNOSIS — Z91199 Patient's noncompliance with other medical treatment and regimen due to unspecified reason: Secondary | ICD-10-CM

## 2024-07-19 NOTE — Progress Notes (Signed)
 THERAPIST PROGRESS NOTE   Session Date: 07/19/2024  Session Time: 1500  Pt contacted office approx. 2 hrs prior to scheduled visit to relay of inability to join today's scheduled OPT appt due to scheduling conflict with appt of mother. Today's missed appt will be considered a no-show. Pt rescheduled appt for Friday, 9/19 at 1100.    [place] JONETTA Maris, MSW, LCSW 07/19/2024,  2:13 PM

## 2024-07-23 ENCOUNTER — Ambulatory Visit (HOSPITAL_COMMUNITY): Admitting: Licensed Clinical Social Worker

## 2024-07-23 DIAGNOSIS — F331 Major depressive disorder, recurrent, moderate: Secondary | ICD-10-CM

## 2024-07-23 DIAGNOSIS — F908 Attention-deficit hyperactivity disorder, other type: Secondary | ICD-10-CM
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
## 2024-07-26 ENCOUNTER — Ambulatory Visit (HOSPITAL_COMMUNITY): Admitting: Licensed Clinical Social Worker

## 2024-08-03 ENCOUNTER — Ambulatory Visit (HOSPITAL_COMMUNITY): Admitting: Licensed Clinical Social Worker

## 2024-08-10 ENCOUNTER — Ambulatory Visit (HOSPITAL_COMMUNITY): Admitting: Licensed Clinical Social Worker

## 2024-08-10 DIAGNOSIS — F909 Attention-deficit hyperactivity disorder, unspecified type: Secondary | ICD-10-CM

## 2024-08-10 DIAGNOSIS — F329 Major depressive disorder, single episode, unspecified: Secondary | ICD-10-CM

## 2024-08-10 DIAGNOSIS — F908 Attention-deficit hyperactivity disorder, other type: Secondary | ICD-10-CM

## 2024-08-10 DIAGNOSIS — F331 Major depressive disorder, recurrent, moderate: Secondary | ICD-10-CM
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
## 2024-08-11 ENCOUNTER — Ambulatory Visit: Admitting: Physician Assistant

## 2024-08-11 ENCOUNTER — Encounter: Payer: Self-pay | Admitting: Physician Assistant

## 2024-08-11 VITALS — BP 158/89 | HR 94

## 2024-08-11 DIAGNOSIS — L918 Other hypertrophic disorders of the skin: Secondary | ICD-10-CM

## 2024-08-11 DIAGNOSIS — L649 Androgenic alopecia, unspecified: Secondary | ICD-10-CM

## 2024-08-11 DIAGNOSIS — L858 Other specified epidermal thickening: Secondary | ICD-10-CM

## 2024-08-11 DIAGNOSIS — Z1283 Encounter for screening for malignant neoplasm of skin: Secondary | ICD-10-CM

## 2024-08-11 DIAGNOSIS — L720 Epidermal cyst: Secondary | ICD-10-CM

## 2024-08-11 DIAGNOSIS — D1801 Hemangioma of skin and subcutaneous tissue: Secondary | ICD-10-CM

## 2024-08-11 DIAGNOSIS — L219 Seborrheic dermatitis, unspecified: Secondary | ICD-10-CM

## 2024-08-11 DIAGNOSIS — D229 Melanocytic nevi, unspecified: Secondary | ICD-10-CM

## 2024-08-11 DIAGNOSIS — L814 Other melanin hyperpigmentation: Secondary | ICD-10-CM | POA: Diagnosis not present

## 2024-08-11 MED ORDER — KETOCONAZOLE 2 % EX SHAM: 1.0000 | MEDICATED_SHAMPOO | Freq: Once | CUTANEOUS | 2 refills | Status: DC

## 2024-08-11 MED ORDER — KETOCONAZOLE 2 % EX SHAM: 1.0000 | MEDICATED_SHAMPOO | Freq: Once | CUTANEOUS | 10 refills | Status: AC

## 2024-08-11 MED ORDER — HYDROCORTISONE 2.5 % EX CREA: TOPICAL_CREAM | CUTANEOUS | 2 refills | Status: AC

## 2024-08-11 MED ORDER — HYDROCORTISONE 2.5 % EX CREA: TOPICAL_CREAM | Freq: Two times a day (BID) | CUTANEOUS | 2 refills | Status: DC | PRN

## 2024-08-11 NOTE — Progress Notes (Addendum)
 New Patient Visit   Subjective  Rick [name] is a 33 y.o. male NEW PATIENT who presents was referred for: Skin tag  Patient states he has skin tag located at the right lateral back that he  would like to have examined. Patient reports the area have been there for 2 years. Gets irritated from clothing.   Other concerns: Desires full skin exam, rash on face, bumps on right ear lobe, and hair loss.   The following portions of the chart were reviewed this encounter and updated as appropriate: medications, allergies, medical history  Review of Systems:  No other skin or systemic complaints except as noted in HPI or Assessment and Plan.  Objective  Well appearing patient in no apparent distress; mood and affect are within normal limits.   A focused examination was performed of the following areas: FULL SKIN EXAM   Relevant exam findings are noted in the Assessment and Plan.    Assessment & Plan   EPIDERMAL CYST x 2  Exam: Subcutaneous nodule at right ear lobe  Benign-appearing. Exam most consistent with an epidermal inclusion cyst. Discussed that a cyst is a benign growth that can grow over time and sometimes get irritated or inflamed. Recommend observation if it is not bothersome. Discussed option of surgical excision to remove it if it is growing, symptomatic, or other changes noted. Please call for new or changing lesions so they can be evaluated.   SEBORRHEIC DERMATITIS  Exam: Pink patches with greasy scale at nose, eye brows and ears  Seborrheic Dermatitis is a chronic persistent rash characterized by pinkness and scaling most commonly of the mid face but also can occur on the scalp (dandruff), ears; mid chest, mid back and groin.  It tends to be exacerbated by stress and cooler weather.  People who have neurologic disease may experience new onset or exacerbation of existing seborrheic dermatitis.  The condition is not curable but treatable and can be controlled.  Treatment  Plan: - start ketoconazole and HC 2.5% as directed    LENTIGINES, HEMANGIOMAS - Benign normal skin lesions - Benign-appearing - Call for any changes  MELANOCYTIC NEVI - Tan-brown and/or pink-flesh-colored symmetric macules and papules - Benign appearing on exam today - Observation - Call clinic for new or changing moles - Recommend daily use of broad spectrum spf 30+ sunscreen to sun-exposed areas.   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 Genetic in nature. No cure. - Observe. - If desired, patient can use an emollient (moisturizer) containing ammonium lactate (AmLactin), urea or salicylic acid once a day to smooth the area  Recommend starting moisturizer with exfoliant (Urea, Salicylic acid, or Lactic acid) one to two times daily to help smooth rough and bumpy skin.  OTC options include Cetaphil Rough and Bumpy lotion (Urea), Eucerin Roughness Relief lotion or spot treatment cream (Urea), CeraVe SA lotion/cream for Rough and Bumpy skin (Sal Acid), Gold Bond Rough and Bumpy cream (Sal Acid), and AmLactin 12% lotion/cream (Lactic Acid).  If applying in morning, also apply sunscreen to sun-exposed areas, since these exfoliating moisturizers can increase sensitivity to sun.    ANDROGENIC ALOPECIA - SCALP  - lengthy conversation regarding condition and treatment options to include finasteride and minoxidil.  - patient deferred treatment at this time and would like to do some research  INFLAMED SKIN TAG Right Lower Back Destruction of lesion - Right Lower Back Complexity: simple   Destruction method: cryotherapy  Informed consent: discussed and consent obtained   Timeout:  patient name, date of birth, surgical site, and procedure verified Lesion destroyed using liquid nitrogen: Yes   Region frozen until ice ball extended beyond lesion: Yes   Outcome: patient tolerated procedure well with no complications    Post-procedure details: wound care instructions given    SEBORRHEIC DERMATITIS   Related Medications hydrocortisone 2.5 % cream Apply daily to affected areas for 2 weeks then decrease to 3x week. ketoconazole (NIZORAL) 2 % shampoo Apply 1 Application topically once for 1 dose. Apply to effected areas for two weeks then MWF SCREENING EXAM FOR SKIN CANCER   MULTIPLE BENIGN NEVI   LENTIGINES   CHERRY ANGIOMA   KERATOSIS PILARIS   ANDROGENIC ALOPECIA    Return in about 1 year (around 08/11/2025) for TBSE follow up.  I, Doyce Pan, CMA, am acting as scribe for Demarqus Jocson K, PA-C.   Documentation: I have reviewed the above documentation for accuracy and completeness, and I agree with the above.  Keeon Zurn K, PA-C

## 2024-08-11 NOTE — Patient Instructions (Addendum)

## 2024-08-16 ENCOUNTER — Ambulatory Visit (HOSPITAL_COMMUNITY): Admitting: Licensed Clinical Social Worker

## 2024-08-24 ENCOUNTER — Ambulatory Visit (HOSPITAL_COMMUNITY): Admitting: Licensed Clinical Social Worker

## 2024-08-24 DIAGNOSIS — F908 Attention-deficit hyperactivity disorder, other type: Secondary | ICD-10-CM

## 2024-08-24 DIAGNOSIS — F331 Major depressive disorder, recurrent, moderate: Secondary | ICD-10-CM | POA: Diagnosis not present

## 2024-08-24 NOTE — Progress Notes (Unsigned)
 THERAPIST PROGRESS NOTE   Session Date: 08/24/2024  Session Time: 1307 - 1402 Virtual Visit via Video Note  I connected with Rick [name] on 08/24/24 at  1:00 PM EDT by a video enabled telemedicine application and verified that I am speaking with the correct person using two identifiers.  Location: Patient: Home Provider: Home Office   I discussed the limitations of evaluation and management by telemedicine and the availability of in person appointments. The patient expressed understanding and agreed to proceed.  I discussed the assessment and treatment plan with the patient. The patient was provided an opportunity to ask questions and all were answered. The patient agreed with the plan and demonstrated an understanding of the instructions.   The patient was advised to call back or seek an in-person evaluation if the symptoms worsen or if the condition fails to improve as anticipated.  I provided 55 minutes of non-face-to-face time during this encounter.  Participation Level: Active  MSE/Presentation: Behavior: Appropriate and Sharing Speech: Normal Thought Process: Coherent Cognition: Alert and Appropriate Mood: Euthymic Affect: Congruent Insight: Improving Appearance: Casual  Type of Therapy: Individual Therapy  Treatment Goals addressed:   Initial (5) LTG: Reduce frequency, intensity, and duration of depression symptoms so that daily functioning is improved (OP Depression) LTG: Increase coping skills to manage depression and improve ability to perform daily activities (OP Depression) STG: Jurell will reduce frequency of avoidant behaviors by 50% as evidenced by self-report in therapy sessions (OP Depression) LTG: Arber will reduce the amount of anger-related incidents/outbursts by 25% as evidenced by self-report (Anger Management) STG: Bernell will identify situations, thoughts, and feelings that trigger internal anger, and/or angry/aggressive actions as evidenced by  self-report (Anger Management)  Progress Towards Goals: Progressing  Interventions: CBT, Motivational Interviewing, Solution Focused, and Supportive  Summary: Rick [name] is a 33 y.o. male with psych history of MDD and ADHD, presenting for follow-up therapy session in efforts to improve management of depressive symptoms.   Patient actively engaged in session, presenting in pleasant moods, and congruent affect throughout duration of visit. Patient openly engaged in introductory check-in, sharing of Doing alright, further detailing of being a little more anxious that usual, attributing to family factors and finances, having been a little back and forth, resulting in yelling between pt and mother, surrounding selling a house. Pt further detailed how parents divorce proves to contribute to added financial strain experienced by the family. Further processed nature of relationship between pt and mother, pt and brother, and pt's compromised abilities to focus on self and own individual wants and needs due to being primary caregiver/support for mother over the past 5 years. Pt identified varying implications that being primary caregiver/support for mother has had on life, specifically in relation to socialization and abilities in pursuing and maintaining romantic relationships. Actively explored potential interest in securing full-time employment, exploring pt's anticipated timeframe for when he expects to be able to pursue opportunities.     06/29/2024    2:24 PM 05/26/2024   10:16 AM 05/03/2024    1:34 PM 03/22/2024    8:07 AM  GAD 7 : Generalized Anxiety Score  Nervous, Anxious, on Edge 1 1 1 1   Control/stop worrying 0 0 1 1  Worry too much - different things 1 1 1 1   Trouble relaxing 0 1 0 1  Restless 1 0 0 1  Easily annoyed or irritable 1 1 1 2   Afraid - awful might happen 0 0 0 0  Total GAD 7  Score 4 4 4 7   Anxiety Difficulty Somewhat difficult Somewhat difficult Somewhat difficult Somewhat  difficult      06/29/2024    2:28 PM 05/26/2024   10:21 AM 05/03/2024    1:24 PM 03/22/2024    8:09 AM 03/17/2024    1:35 PM  Depression screen PHQ 2/9  Decreased Interest 1 1 1 1  0  Down, Depressed, Hopeless 1 1 1  0 0  PHQ - 2 Score 2 2 2 1  0  Altered sleeping 1 1 1 1    Tired, decreased energy 1 1 1 1    Change in appetite 0 0 1 1   Feeling bad or failure about yourself  1 0 0 1   Trouble concentrating 1 1 0 0   Moving slowly or fidgety/restless 1 0 0 0   Suicidal thoughts 0 0 0 0   PHQ-9 Score 7 5 5 5    Difficult doing work/chores Somewhat difficult Somewhat difficult Somewhat difficult Somewhat difficult      Suicidal/Homicidal: None, No plan to harm self or others  Therapist Response:  Clinician openly greeted pt upon presenting for today's visit, assessing presenting moods and affect, engaging pt in check-in, exploring daily events and presenting moods. Further engaged through utilizing open ended questions in evoking recounts of events of the past two weeks, exploring newly identified/observed stressors, ongoing stressors, implications on moods and interactions with others, and individual means of navigating challenges. Utilized active listening techniques in providing support and validation of pt's identified thoughts and feelings surrounding stress and challenges, and further supporting in processing feelings. Utilized CBT, MI, supportive reflections, and family system techniques to aid pt in navigating challenges and perspectives related to such.  Clinician reassessed severity of presenting sxs, and presence of any safety concerns.   []  Cognitive Challenging []  Cognitive Refocusing [x]  Cognitive Reframing  [x]  Communication Skills []  Compliance Issues []  DBT [x]  Exploration of Coping Patterns [x]  Exploration of Emotions [x]  Exploration of Relationship Patterns []  Guided Imagery []  Interactive Feedback [x]  Interpersonal Resolutions []  Mindfulness Training []  Preventative  Services [x]  Psycho-Education  []  Relaxation/Deep Breathing []  Review of Treatment Plan/Progress []  Role-Play/Behavioral Rehearsal  [x]  Structured Problem Solving [x]  Supportive Reflection []  Symptom Management  []  Other   Patient responded well to interventions. Patient continues to meet criteria for MDD and ADHD. Patient will continue to benefit from engagement in outpatient therapy due to being the least restrictive service to meet presenting needs. Pt proves to maintain minimal progression towards goals.  Homework: None.  Plan: Return again in 2 weeks.  Diagnosis:  Encounter Diagnoses  Name Primary?   Moderate episode of recurrent major depressive disorder (HCC) Yes   Attention-deficit hyperactivity disorder, other type      Collaboration of Care: Psychiatrist AEB provider documentation available in EHR.  Patient/Guardian was advised Release of Information must be obtained prior to any record release in order to collaborate their care with an outside provider. Patient/Guardian was advised if they have not already done so to contact the registration department to sign all necessary forms in order for us  to release information regarding their care.   Consent: Patient/Guardian gives verbal consent for treatment and assignment of benefits for services provided during this visit. Patient/Guardian expressed understanding and agreed to proceed.   Lynwood JONETTA Maris, MSW, LCSW 08/24/2024,  1:09 PM

## 2024-08-25 ENCOUNTER — Telehealth (HOSPITAL_COMMUNITY): Payer: Self-pay

## 2024-08-25 DIAGNOSIS — F908 Attention-deficit hyperactivity disorder, other type: Secondary | ICD-10-CM

## 2024-08-25 MED ORDER — METHYLPHENIDATE HCL 5 MG PO TABS: 5.0000 mg | ORAL_TABLET | Freq: Every day | ORAL | 0 refills | Status: AC | PRN

## 2024-08-25 MED ORDER — METHYLPHENIDATE HCL ER (LA) 10 MG PO CP24: 10.0000 mg | ORAL_CAPSULE | Freq: Every day | ORAL | 0 refills | Status: DC

## 2024-08-25 NOTE — Telephone Encounter (Signed)
 PDMP reviewed 20 tabs of each  script sent to the requested pharmacy

## 2024-08-25 NOTE — Telephone Encounter (Signed)
 Patient called, his pharmacy has Ritalin  on back order, they gave him 10 tabs of each, but do not have any more. Patient would like the remaining 20 tabs to be sent to [facility] on [address]. Patient called and they have it in stock. Please review and advise, thank you

## 2024-08-30 ENCOUNTER — Ambulatory Visit (HOSPITAL_COMMUNITY): Admitting: Licensed Clinical Social Worker

## 2024-09-01 NOTE — Progress Notes (Signed)
 BH MD/PA/NP OP Progress Note  09/10/2024 11:27 AM [name]  MRN:  [id number]  Visit Diagnosis:    ICD-10-CM   1. Attention-deficit hyperactivity disorder, other type  F90.8 buPROPion  (WELLBUTRIN  XL) 150 MG 24 hr tablet    buPROPion  (WELLBUTRIN  XL) 300 MG 24 hr tablet    methylphenidate  (RITALIN  LA) 10 MG 24 hr capsule    methylphenidate  (RITALIN  LA) 10 MG 24 hr capsule    2. Moderate episode of recurrent major depressive disorder (HCC)  F33.1 buPROPion  (WELLBUTRIN  XL) 150 MG 24 hr tablet    buPROPion  (WELLBUTRIN  XL) 300 MG 24 hr tablet      Assessment: Rick [name] is a 33 y.o. male with a history of  MDD and ADHD who presented to [facility] for initial evaluation on 10/17/2023.  At initial evaluation patient reported a history of depression that has been well controlled.  Patient reported symptoms began around 2009 at which time he endorsed low mood, increased isolation, negative self thoughts, feelings of worthlessness, and passive SI.  He denied ever experiencing any active SI.  Patient also was diagnosed with ADD at that time reporting having completed neuropsych testing and experiencing poor attention and concentration.  This has also been stable on medication for a number of years prior to initial evaluation.  Patient met criteria for MDD currently in remission and ADHD on initial evaluation.  Rick [name] presents for follow-up evaluation. Today, 09/10/24, patients mood, anxiety, and ADHD symptoms have all remained stable and well-controlled.  He is taking medication consistently.  Previously reported GI concerns have improved with identification of diet being the contributing factor.  At this point only identifiable side effect of medications is increased appetite in the evenings as the Ritalin  leaves the system.  Patient finds this manageable.  We will continue on his current regimen and follow-up in 3 months.  Plan: - Continue  Wellbutrin  xl 450 mg daily - Continue Ritalin  LA 10 mg daily  - Continue Ritalin  IR 5 mg Q12-2pm daily - CBC, CMP, lipid profile, vitamin D , TSH, and U-Tox reviewed - Neuropsych testing, reviewed and confirmed ADHD diagnosis - Continue therapy with Lynwood - Crisis resources reviewed - Follow up in 3 months  Chief Complaint:  Chief Complaint  Patient presents with   Follow-up   HPI: Rick [name] presents reporting that things have been well with no significant changes over the past few months.  Mood and attention symptoms have both remained well-controlled.  Patient still is focusing primarily on caring for his mother and she is going to need a back surgery in the future.  In addition there has been some house maintenance he is dealing with lately as well as the ongoing assistance in his parents divorce.  Patient notes that the bowel issues reported previously have resolved.  He identified that they were more related to the hot sauce and spicy foods he was consuming and resolved after cutting back on these.  [name] reports that he is taking the Ritalin  LA consistently only occasionally missing on the weekends or when he oversleeps.  On those days he just takes the immediate release in the afternoons.  The only identifiable side effects is the resurgence of appetite when the medication wears off in the evening.  [name] feels that the benefits of the medicine outweigh any negatives.  Past Psychiatric History: Had seen a psychiatrist in 2009 who dx him with ADD and depression. His PCP have managed his medications since.  He saw them for a few visits. He then saw a therapist for 3 years followed by one off and on for 5 years throughout college.  Patient denies any prior psychiatric hospitalizations or past suicide attempts.  Patient has taken Ritalin  and Xanax in the past for sleep  Has a drink every 1-2 weeks. Tried marijuana in college gave him anxiety. Occasional coffee.    Past Medical History:  Past  Medical History:  Diagnosis Date   ADHD (attention deficit hyperactivity disorder)    Depression    Keratoconus    Tinnitus of right ear     Past Surgical History:  Procedure Laterality Date   EYE SURGERY Bilateral 11/10/2023   HERNIA REPAIR     WISDOM TOOTH EXTRACTION      Family History:  Family History  Problem Relation Age of Onset   Stroke Mother    Cancer Maternal Grandmother    Heart attack Maternal Grandfather    Epilepsy Maternal Grandfather     Social History:  Social History   Socioeconomic History   Marital status: Single    Spouse name: Not on file   Number of children: Not on file   Years of education: Not on file   Highest education level: Bachelor's degree (e.g., BA, AB, BS)  Occupational History   Not on file  Tobacco Use   Smoking status: Never   Smokeless tobacco: Never  Vaping Use   Vaping status: Never Used  Substance and Sexual Activity   Alcohol use: Yes   Drug use: Not Currently    Types: Marijuana   Sexual activity: Not on file  Other Topics Concern   Not on file  Social History Narrative   Not on file   Social Drivers of Health   Financial Resource Strain: Low Risk  (03/17/2024)   Overall Financial Resource Strain (CARDIA)    Difficulty of Paying Living Expenses: Not very hard  Food Insecurity: No Food Insecurity (03/17/2024)   Hunger Vital Sign    Worried About Running Out of Food in the Last Year: Never true    Ran Out of Food in the Last Year: Never true  Transportation Needs: No Transportation Needs (03/17/2024)   PRAPARE - Administrator, Civil Service (Medical): No    Lack of Transportation (Non-Medical): No  Physical Activity: Insufficiently Active (03/17/2024)   Exercise Vital Sign    Days of Exercise per Week: 1 day    Minutes of Exercise per Session: 20 min  Stress: Stress Concern Present (03/17/2024)   [employer] of Occupational Health - Occupational Stress Questionnaire    Feeling of Stress : To  some extent  Social Connections: Socially Isolated (03/17/2024)   Social Connection and Isolation Panel    Frequency of Communication with Friends and Family: Never    Frequency of Social Gatherings with Friends and Family: Never    Attends Religious Services: 1 to 4 times per year    Active Member of [employer] or Organizations: No    Attends Engineer, Structural: Not on file    Marital Status: Never married    Allergies: No Known Allergies  Current Medications: Current Outpatient Medications  Medication Sig Dispense Refill   buPROPion  (WELLBUTRIN  XL) 150 MG 24 hr tablet Take 1 tablet (150 mg total) by mouth daily. Take with 300 mg tab for a total of 450 mg 90 tablet 0   buPROPion  (WELLBUTRIN  XL) 300 MG 24 hr tablet Take 1 tablet (300 mg total)  by mouth every morning. 90 tablet 0   hydrocortisone 2.5 % cream Apply daily to affected areas for 2 weeks then decrease to 3x week. 28 g 2   methylphenidate  (RITALIN  LA) 10 MG 24 hr capsule Take 1 capsule (10 mg total) by mouth daily. 20 capsule 0   [START ON 09/21/2024] methylphenidate  (RITALIN  LA) 10 MG 24 hr capsule Take 1 capsule (10 mg total) by mouth daily. 30 capsule 0   [START ON 10/21/2024] methylphenidate  (RITALIN  LA) 10 MG 24 hr capsule Take 1 capsule (10 mg total) by mouth daily. 30 capsule 0   methylphenidate  (RITALIN ) 5 MG tablet Take 1 tablet (5 mg total) by mouth daily. Take between noon and 2 PM 30 tablet 0   methylphenidate  (RITALIN ) 5 MG tablet Take 1 tablet (5 mg total) by mouth daily. Take between noon and 2 PM 30 tablet 0   methylphenidate  (RITALIN ) 5 MG tablet Take 1 tablet (5 mg total) by mouth daily as needed. Take between noon and 2 pm 20 tablet 0   No current facility-administered medications for this visit.     Psychiatric Specialty Exam: Review of Systems  There were no vitals taken for this visit.There is no height or weight on file to calculate BMI.  General Appearance: Well Groomed  Eye Contact:  Good   Speech:  Clear and Coherent  Volume:  Normal  Mood:  Euthymic  Affect:  Appropriate  Thought Process:  Coherent  Orientation:  Full (Time, Place, and Person)  Thought Content: Logical   Suicidal Thoughts:  No  Homicidal Thoughts:  No  Memory:  Immediate;   Good  Judgement:  Good  Insight:  Fair  Psychomotor Activity:  Normal  Concentration:  Concentration: Good  Recall:  Good  Fund of Knowledge: Good  Language: Good  Akathisia:  NA    AIMS (if indicated): not done  Assets:  Communication Skills Desire for Improvement Financial Resources/Insurance Housing Talents/Skills Transportation  ADL's:  Intact  Cognition: WNL  Sleep:  Good   Metabolic Disorder Labs: No results found for: HGBA1C, MPG No results found for: PROLACTIN Lab Results  Component Value Date   CHOL 183 03/19/2024   TRIG 88 03/19/2024   HDL 41 03/19/2024   CHOLHDL 4.5 03/19/2024   LDLCALC 123 (H) 03/19/2024   LDLCALC 116 (H) 09/22/2023   Lab Results  Component Value Date   TSH 1.14 03/19/2024   TSH 1.90 09/22/2023    Therapeutic Level Labs: No results found for: LITHIUM No results found for: VALPROATE No results found for: CBMZ   Screenings: GAD-7    Advertising Copywriter from 06/29/2024 in [facility] at [facility] Video Visit from 05/26/2024 in [facility] at [facility] from 05/03/2024 in [facility] at [place] Counselor from 03/22/2024 in [facility] at [facility] Visit from 10/17/2023 in [facility]  Total GAD-7 Score 4 4 4 7 4    [employer]    Flowsheet Row Counselor from 06/29/2024 in [facility] [facility] at [facility] Video Visit from 05/26/2024 in [facility] at [facility] from 05/03/2024 in [facility] at [place]  Counselor from 03/22/2024 in [facility] at [facility] Visit from 03/17/2024 in [facility]  PHQ-2 Total Score 2 2 2 1  0  PHQ-9 Total Score 7 5 5 5  --   Flowsheet  Row Office Visit from 10/17/2023 in [facility]  C-SSRS RISK CATEGORY No Risk    Collaboration of Care: Collaboration of Care: Medication Management AEB medication prescription  Patient/Guardian was advised Release of Information must be obtained prior to any record release in order to collaborate their care with an outside provider. Patient/Guardian was advised if they have not already done so to contact the registration department to sign all necessary forms in order for us  to release information regarding their care.   Consent: Patient/Guardian gives verbal consent for treatment and assignment of benefits for services provided during this visit. Patient/Guardian expressed understanding and agreed to proceed.    Arvella CHRISTELLA Finder, MD 09/10/2024, 11:27 AM   Virtual Visit via Video Note  I connected with Maude Burr on 09/10/24 at 11:00 AM EST by a video enabled telemedicine application and verified that I am speaking with the correct person using two identifiers.  Location: Patient: Home Provider: Home Office   I discussed the limitations of evaluation and management by telemedicine and the availability of in person appointments. The patient expressed understanding and agreed to proceed.   I discussed the assessment and treatment plan with the patient. The patient was provided an opportunity to ask questions and all were answered. The patient agreed with the plan and demonstrated an understanding of the instructions.   The patient was advised to call back or seek an in-person evaluation if the symptoms worsen or if the condition fails to improve as anticipated.  I provided 15 minutes of non-face-to-face time during  this encounter.   Arvella CHRISTELLA Finder, MD

## 2024-09-07 ENCOUNTER — Ambulatory Visit (INDEPENDENT_AMBULATORY_CARE_PROVIDER_SITE_OTHER): Admitting: Licensed Clinical Social Worker

## 2024-09-07 DIAGNOSIS — F908 Attention-deficit hyperactivity disorder, other type: Secondary | ICD-10-CM | POA: Diagnosis not present

## 2024-09-07 DIAGNOSIS — F331 Major depressive disorder, recurrent, moderate: Secondary | ICD-10-CM

## 2024-09-07 NOTE — Progress Notes (Unsigned)
 THERAPIST PROGRESS NOTE   Session Date: 09/07/2024  Session Time: 1407 - 1501 Virtual Visit via Video Note  I connected with Rick [name] on 09/07/24 at  2:00 PM EST by a video enabled telemedicine application and verified that I am speaking with the correct person using two identifiers.  Location: Patient: Home Provider: Home Office   I discussed the limitations of evaluation and management by telemedicine and the availability of in person appointments. The patient expressed understanding and agreed to proceed.  I discussed the assessment and treatment plan with the patient. The patient was provided an opportunity to ask questions and all were answered. The patient agreed with the plan and demonstrated an understanding of the instructions.   The patient was advised to call back or seek an in-person evaluation if the symptoms worsen or if the condition fails to improve as anticipated.  I provided 54 minutes of non-face-to-face time during this encounter.  Participation Level: Active  MSE/Presentation: Behavior: Appropriate and Sharing Speech: Normal Thought Process: Coherent Cognition: Alert and Appropriate Mood: Euthymic Affect: Congruent Insight: Improving Appearance: Casual  Type of Therapy: Individual Therapy  Treatment Goals addressed:   Initial (5) LTG: Reduce frequency, intensity, and duration of depression symptoms so that daily functioning is improved (OP Depression) LTG: Increase coping skills to manage depression and improve ability to perform daily activities (OP Depression) STG: Rick [name] will reduce frequency of avoidant behaviors by 50% as evidenced by self-report in therapy sessions (OP Depression) LTG: Rick [name] will reduce the amount of anger-related incidents/outbursts by 25% as evidenced by self-report (Anger Management) STG: Rick [name] will identify situations, thoughts, and feelings that trigger internal anger, and/or angry/aggressive actions as evidenced by  self-report (Anger Management)  Progress Towards Goals: Progressing  Interventions: CBT, Motivational Interviewing, Solution Focused, and Supportive  Summary: Rick [name] is a 33 y.o. male with psych history of MDD and ADHD, presenting for follow-up therapy session in efforts to improve management of depressive symptoms.   Patient actively engaged in session, presenting in pleasant moods, and congruent affect throughout duration of visit. 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 Pt shared of things going well overall over the past two weeks, sharing of only continued stress experienced being surrounding needing windows replaced in the home. Pt engaged in further exploration of social interactions and connections, exploring proximity of fellow alumni, and creation of new connections over recent homecoming weekend. Further engaged in exploration of factors that create challenges with social connections and interactions, specifically noting hair loss proving to be a source of anxiousness, proving manageable when able to wear a hat, however identifying this to cause anxiousness when in social settings being that It's one of the first thing the world sees, and further engaging in exploration of impact to self-esteem and identifying potential area for continued exploration.     06/29/2024    2:24 PM 05/26/2024   10:16 AM 05/03/2024    1:34 PM 03/22/2024    8:07 AM  GAD 7 : Generalized Anxiety Score  Nervous, Anxious, on Edge 1 1 1 1   Control/stop worrying 0 0 1 1  Worry too much - different things 1 1 1 1   Trouble relaxing 0 1 0 1  Restless 1 0  0 1  Easily annoyed or irritable 1 1 1 2   Afraid -  awful might happen 0 0 0 0  Total GAD 7 Score 4 4 4 7   Anxiety Difficulty Somewhat difficult Somewhat difficult Somewhat difficult Somewhat difficult      06/29/2024    2:28 PM 05/26/2024   10:21 AM 05/03/2024    1:24 PM 03/22/2024    8:09 AM 03/17/2024    1:35 PM  Depression screen PHQ 2/9  Decreased Interest 1 1 1 1  0  Down, Depressed, Hopeless 1 1 1  0 0  PHQ - 2 Score 2 2 2 1  0  Altered sleeping 1 1 1 1    Tired, decreased energy 1 1 1 1    Change in appetite 0 0 1 1   Feeling bad or failure about yourself  1 0 0 1   Trouble concentrating 1 1 0 0   Moving slowly or fidgety/restless 1 0 0 0   Suicidal thoughts 0 0 0 0   PHQ-9 Score 7 5 5 5    Difficult doing work/chores Somewhat difficult Somewhat difficult Somewhat difficult Somewhat difficult      Suicidal/Homicidal: None, No plan to harm self or others  Therapist Response:  Clinician openly greeted pt upon presenting for today's visit, assessing presenting moods and affect, exploring daily events and presenting moods. Further engaged in utilizing open ended questions in evoking recounts of events of the past two weeks, exploring newly identified/observed stressors, ongoing areas of difficulty, observed implications on moods, and individual means of navigating challenges. Utilized active listening techniques in providing support and validation of pt's recounts of events, utilizing socratic questions in prompting greater exploration of experienced feelings surrounding recent enjoyable social interactions, identified thoughts and feelings surrounding presenting stress and challenges, and further supporting in processing feelings. Utilized CBT, MI, and supportive reflections techniques to aid pt in navigating challenges and perspectives related to such.  Clinician reassessed severity of presenting sxs, and presence of any safety concerns.   [x]  Cognitive Challenging []  Cognitive Refocusing [x]  Cognitive Reframing  [x]  Communication  Skills []  Compliance Issues []  DBT [x]  Exploration of Coping Patterns [x]  Exploration of Emotions [x]  Exploration of Relationship Patterns []  Guided Imagery []  Interactive Feedback [x]  Interpersonal Resolutions []  Mindfulness Training []  Preventative Services [x]  Psycho-Education  []  Relaxation/Deep Breathing []  Review of Treatment Plan/Progress []  Role-Play/Behavioral Rehearsal  [x]  Structured Problem Solving [x]  Supportive Reflection []  Symptom Management  []  Other   Patient responded well to interventions. Patient continues to meet criteria for MDD and ADHD. Patient will continue to benefit from engagement in outpatient therapy due to being the least restrictive service to meet presenting needs. Pt proves to maintain minimal progression towards goals.  Homework: None.  Plan: Return again in 2 weeks.  Diagnosis:  Encounter Diagnoses  Name Primary?   Moderate episode of recurrent major depressive disorder (HCC) Yes   Attention-deficit hyperactivity disorder, other type     Collaboration of Care: Psychiatrist AEB provider documentation available in EHR.  Patient/Guardian was advised Release of Information must be obtained prior to any record release in order to collaborate their care with an outside provider. Patient/Guardian was advised if they have not already done so to contact the registration department to sign all necessary forms in order for us  to release information regarding their care.   Consent: Patient/Guardian gives verbal consent for treatment and assignment of benefits for services provided during this visit. Patient/Guardian expressed understanding and agreed to proceed.   Noheli Melder D Sakshi Sermons,  MSW, LCSW 09/07/2024,  2:07 PM

## 2024-09-10 ENCOUNTER — Encounter (HOSPITAL_COMMUNITY): Payer: Self-pay | Admitting: Psychiatry

## 2024-09-10 ENCOUNTER — Telehealth (HOSPITAL_COMMUNITY): Admitting: Psychiatry

## 2024-09-10 DIAGNOSIS — F331 Major depressive disorder, recurrent, moderate: Secondary | ICD-10-CM | POA: Diagnosis not present

## 2024-09-10 DIAGNOSIS — F908 Attention-deficit hyperactivity disorder, other type: Secondary | ICD-10-CM | POA: Diagnosis not present

## 2024-09-10 MED ORDER — BUPROPION HCL ER (XL) 300 MG PO TB24: 300.0000 mg | ORAL_TABLET | ORAL | 0 refills | Status: DC

## 2024-09-10 MED ORDER — BUPROPION HCL ER (XL) 150 MG PO TB24: 150.0000 mg | ORAL_TABLET | Freq: Every day | ORAL | 0 refills | Status: DC

## 2024-09-10 MED ORDER — METHYLPHENIDATE HCL ER (LA) 10 MG PO CP24: 10.0000 mg | ORAL_CAPSULE | Freq: Every day | ORAL | 0 refills | Status: DC

## 2024-09-17 ENCOUNTER — Ambulatory Visit: Payer: Self-pay | Admitting: Family

## 2024-09-21 ENCOUNTER — Ambulatory Visit (INDEPENDENT_AMBULATORY_CARE_PROVIDER_SITE_OTHER): Payer: Self-pay | Admitting: Licensed Clinical Social Worker

## 2024-09-21 ENCOUNTER — Encounter (HOSPITAL_COMMUNITY): Payer: Self-pay

## 2024-09-21 ENCOUNTER — Encounter: Payer: Self-pay | Admitting: Family

## 2024-09-21 ENCOUNTER — Ambulatory Visit: Admitting: Family

## 2024-09-21 VITALS — BP 130/70 | HR 98 | Temp 98.4°F | Resp 18 | Ht 71.5 in | Wt 159.8 lb

## 2024-09-21 DIAGNOSIS — Z0000 Encounter for general adult medical examination without abnormal findings: Secondary | ICD-10-CM

## 2024-09-21 DIAGNOSIS — Z23 Encounter for immunization: Secondary | ICD-10-CM | POA: Diagnosis not present

## 2024-09-21 DIAGNOSIS — F331 Major depressive disorder, recurrent, moderate: Secondary | ICD-10-CM

## 2024-09-21 DIAGNOSIS — F909 Attention-deficit hyperactivity disorder, unspecified type: Secondary | ICD-10-CM

## 2024-09-21 DIAGNOSIS — Z91199 Patient's noncompliance with other medical treatment and regimen due to unspecified reason: Secondary | ICD-10-CM

## 2024-09-21 NOTE — Progress Notes (Signed)
 THERAPIST PROGRESS NOTE   Session Date: 09/21/2024  Session Time: 1100  Rick [name]    Clinician attempted to connect with patient for scheduled appointment via Caregility video, sending text request 3x with no response.     Attempt 1: Text: 1111   Attempt 2: Text: 1112    Attempt 3: Text: 1113   Disconnected video visit at:  1114     Per [facility] policy, after multiple attempts to reach patient unsuccessfully at appointed time, visit will be coded as a no show.  Rick [name], MSW, LCSW 09/21/2024,  11:14 AM

## 2024-09-21 NOTE — Patient Instructions (Signed)
 1.Report to local pharmacy to receive Hepatitis B & Covid Vaccines.

## 2024-09-21 NOTE — Progress Notes (Signed)
 Provider: Roxan Plough FNP-C   Aman Bonet, Roxan BROCKS, NP  Patient Care Team: [name], Roxan BROCKS, NP as PCP - General (Family Medicine) Meridee Seltzer, MD as Consulting Physician (Ophthalmology)  Extended Emergency Contact Information Primary Emergency Contact: Rozelle,[name] Address: [address]          [place], [place] [address] [place] Mobile Phone: [phone] Relation: Mother Secondary Emergency Contact: Milholland,[name] Mobile Phone: [phone] Relation: Brother Preferred language: English Interpreter needed? No  Code Status:  Full Code  Goals of care: Advanced Directive information    09/21/2024    3:14 PM  Advanced Directives  Does Patient Have a Medical Advance Directive? No  Would patient like information on creating a medical advance directive? No - Patient declined     Chief Complaint  Patient presents with   Medical Management of Chronic Issues    6 Month follow up.  .   Discussed the use of AI scribe software for clinical note transcription with the patient, who gave verbal consent to proceed.  History of Present Illness   Rick [name] is a 33 year old male who presents for a six-month follow-up physical exam.  He is currently taking Wellbutrin  at a total dose of 450 mg daily, consisting of 150 mg and 300 mg doses. He uses hydrocortisone cream as needed. He continues to take Ritalin , with a regimen of 10 mg capsules and 5 mg tablets, the latter as needed. He experiences tremors in his hands and legs, which he associates with his Ritalin  use.  He used ear wax loosener about a month ago, which successfully cleared up earwax without any lasting pain.  He experiences occasional acid reflux, particularly after consuming spicy or acidic foods, and tries to avoid these triggers.  He consumes alcohol occasionally, about once every other week, and denies daily use. He acknowledges the importance of incorporating walking into his routine but  admits to not exercising regularly. He sleeps approximately nine hours per night.    Past Medical History:  Diagnosis Date   ADHD (attention deficit hyperactivity disorder)    Depression    Keratoconus    Tinnitus of right ear    Past Surgical History:  Procedure Laterality Date   EYE SURGERY Bilateral 11/10/2023   HERNIA REPAIR     SKIN TAG REMOVAL  08/2024   WISDOM TOOTH EXTRACTION      No Known Allergies  Allergies as of 09/21/2024   No Known Allergies      Medication List        Accurate as of September 21, 2024  4:15 PM. If you have any questions, ask your nurse or doctor.          buPROPion  150 MG 24 hr tablet Commonly known as: Wellbutrin  XL Take 1 tablet (150 mg total) by mouth daily. Take with 300 mg tab for a total of 450 mg   buPROPion  300 MG 24 hr tablet Commonly known as: Wellbutrin  XL Take 1 tablet (300 mg total) by mouth every morning.   hydrocortisone 2.5 % cream Apply daily to affected areas for 2 weeks then decrease to 3x week. What changed:  when to take this reasons to take this   methylphenidate  5 MG tablet Commonly known as: Ritalin  Take 1 tablet (5 mg total) by mouth daily. Take between noon and 2 PM   methylphenidate  5 MG tablet Commonly known as: Ritalin  Take 1 tablet (5 mg total) by mouth daily as needed. Take between noon  and 2 pm   methylphenidate  10 MG 24 hr capsule Commonly known as: RITALIN  LA Take 1 capsule (10 mg total) by mouth daily.   methylphenidate  5 MG tablet Commonly known as: Ritalin  Take 1 tablet (5 mg total) by mouth daily. Take between noon and 2 PM   methylphenidate  10 MG 24 hr capsule Commonly known as: RITALIN  LA Take 1 capsule (10 mg total) by mouth daily.   methylphenidate  10 MG 24 hr capsule Commonly known as: RITALIN  LA Take 1 capsule (10 mg total) by mouth daily. Start taking on: October 21, 2024        Review of Systems  Constitutional:  Negative for appetite change, chills, fatigue,  fever and unexpected weight change.  HENT:  Negative for congestion, dental problem, ear discharge, ear pain, facial swelling, hearing loss, nosebleeds, postnasal drip, rhinorrhea, sinus pressure, sinus pain, sneezing, sore throat, tinnitus and trouble swallowing.   Eyes:  Negative for pain, discharge, redness, itching and visual disturbance.  Respiratory:  Negative for cough, chest tightness, shortness of breath and wheezing.   Cardiovascular:  Negative for chest pain, palpitations and leg swelling.  Gastrointestinal:  Negative for abdominal distention, abdominal pain, blood in stool, constipation, diarrhea, nausea and vomiting.  Endocrine: Negative for cold intolerance, heat intolerance, polydipsia, polyphagia and polyuria.  Genitourinary:  Negative for difficulty urinating, dysuria, flank pain, frequency and urgency.  Musculoskeletal:  Negative for arthralgias, back pain, gait problem, joint swelling, myalgias, neck pain and neck stiffness.  Skin:  Negative for color change, pallor, rash and wound.  Neurological:  Positive for tremors. Negative for dizziness, syncope, speech difficulty, weakness, light-headedness, numbness and headaches.  Hematological:  Does not bruise/bleed easily.  Psychiatric/Behavioral:  Negative for agitation, behavioral problems, confusion, hallucinations, self-injury, sleep disturbance and suicidal ideas. The patient is not nervous/anxious.        Follows up with psychiatrist for depression and ADHD    Immunization History  Administered Date(s) Administered   DTaP 10/11/1993, 12/05/1993, 02/14/1994, 09/26/1995, 06/29/1998   HIB (PRP-OMP) 10/11/1993, 12/05/1993, 02/14/1994, 12/01/1994   Hepatitis A 08/13/2005, 03/21/2006   Hepatitis B 11/07/1993, 03/17/1994   Hpv-Unspecified 12/12/2009, 02/12/2010, 06/19/2010   Influenza, Seasonal, Injecte, Preservative Fre 09/19/2023, 09/21/2024   MMR 12/01/1994, 08/09/1997   Meningococcal Mcv4o 08/13/2005, 02/08/2014   OPV  10/11/1993, 12/05/1993, 02/14/1994, 06/29/1998   Pneumococcal Polysaccharide-23 09/21/1997   Td 02/08/2014   Tdap 08/13/2005, 05/03/2021   Varicella 12/01/1994, 09/26/2007   Pertinent  Health Maintenance Due  Topic Date Due   Influenza Vaccine  Completed      06/30/2023    2:32 PM 03/17/2024    1:34 PM 09/21/2024    3:14 PM  Fall Risk  Falls in the past year? 0 0 0  Was there an injury with Fall? 0 0 0  Fall Risk Category Calculator 0 0 0  Patient at Risk for Falls Due to No Fall Risks No Fall Risks No Fall Risks  Fall risk Follow up Falls evaluation completed Falls prevention discussed;Falls evaluation completed Falls evaluation completed   Functional Status Survey:    Vitals:   09/21/24 1526  BP: 130/70  Pulse: 98  Resp: 18  Temp: 98.4 F (36.9 C)  SpO2: 99%  Weight: 159 lb 12.8 oz (72.5 kg)  Height: 5' 11.5 (1.816 m)   Body mass index is 21.98 kg/m. Physical Exam  GENERAL: Alert, cooperative, well developed, no acute distress. HEENT: Normocephalic, normal oropharynx, moist mucous membranes, tympanic membranes normal bilaterally, nose normal, no sinus tenderness, pupils reactive  to light. CHEST: Clear to auscultation bilaterally, no wheezes, rhonchi, or crackles. CARDIOVASCULAR: Normal heart rate and rhythm, S1 and S2 normal without murmurs. ABDOMEN: Soft, non-tender, non-distended, without organomegaly, normal bowel sounds, liver normal. EXTREMITIES: No cyanosis or edema. MUSCULOSKELETAL: Knee range of motion normal. NEUROLOGICAL: Cranial nerves grossly intact, moves all extremities without gross motor or sensory deficit, extraocular movements intact, finger to nose test normal, motor strength 5/5, reflexes normal, tremors observed in hands and legs.  SKIN: No rash,no lesion or erythema   PSYCHIATRY/BEHAVIORAL: Mood stable    Labs reviewed: Recent Labs    03/19/24 1059  NA 140  K 3.9  CL 104  CO2 29  GLUCOSE 89  BUN 18  CREATININE 0.87  CALCIUM 9.7    Recent Labs    03/19/24 1059  AST 16  ALT 20  BILITOT 0.7  PROT 7.1   Recent Labs    03/19/24 1059  WBC 6.2  NEUTROABS 2,728  HGB 15.6  HCT 47.9  MCV 90.5  PLT 249   Lab Results  Component Value Date   TSH 1.14 03/19/2024   No results found for: HGBA1C Lab Results  Component Value Date   CHOL 183 03/19/2024   HDL 41 03/19/2024   LDLCALC 123 (H) 03/19/2024   TRIG 88 03/19/2024   CHOLHDL 4.5 03/19/2024    Significant Diagnostic Results in last 30 days:  No results found.  Assessment/Plan  Adult Wellness Visit Routine adult wellness visits with no acute concerns. Physical examination was unremarkable Annual eye and. Dental exam up to date. Drinks alcohol occasionally. - Scheduled lab work for CBC, thyroid function, and chemistry panel to assess liver, kidney function, and electrolytes. - Scheduled annual physical exam in one year.  Immunization management Up to date on flu and tetanus vaccines. Has not received COVID vaccine since the year before last and is due for hepatitis B vaccine. - Advised to receive COVID vaccine at pharmacy. - Advised to receive hepatitis B vaccine at pharmacy.  Depression Managed with Wellbutrin  150 mg and 300 mg daily, totaling 450 mg. Follow-up with Dr. Carvin is ongoing. - Continue Wellbutrin  150 mg and 300 mg daily.  Attention-deficit hyperactivity disorder Managed with Ritalin  10 mg capsules and 5 mg tablets as needed. - Continue Ritalin  10 mg capsules and 5 mg tablets as needed.  Tremor Mild tremor in hands and legs, possibly exacerbated by Ritalin  use. - Continue to monitor tremor symptoms.  Family/ staff Communication: Reviewed plan of care with patient verbalized understanding   Labs/tests ordered:  - CBC with Differential/Platelet - CMP with eGFR([facility]) - TSH  Next Appointment : Return in about 1 year (around 09/21/2025) for annual Physical examination, Fasting labs in the morning.   Spent 30 minutes of Face  to face and non-face to face with patient  >50% time spent counseling; reviewing medical record; tests; labs; documentation and developing future plan of care.   Roxan JAYSON Plough, NP

## 2024-09-22 ENCOUNTER — Other Ambulatory Visit

## 2024-09-23 ENCOUNTER — Other Ambulatory Visit

## 2024-09-23 DIAGNOSIS — Z0000 Encounter for general adult medical examination without abnormal findings: Secondary | ICD-10-CM | POA: Diagnosis not present

## 2024-09-23 DIAGNOSIS — F331 Major depressive disorder, recurrent, moderate: Secondary | ICD-10-CM | POA: Diagnosis not present

## 2024-09-23 DIAGNOSIS — F909 Attention-deficit hyperactivity disorder, unspecified type: Secondary | ICD-10-CM

## 2024-09-24 ENCOUNTER — Ambulatory Visit: Payer: Self-pay | Admitting: Family

## 2024-09-24 LAB — COMPLETE METABOLIC PANEL WITHOUT GFR
AG Ratio: 2.3 (calc) (ref 1.0–2.5)
ALT: 23 U/L (ref 9–46)
AST: 18 U/L (ref 10–40)
Albumin: 4.9 g/dL (ref 3.6–5.1)
Alkaline phosphatase (APISO): 56 U/L (ref 36–130)
BUN: 16 mg/dL (ref 7–25)
CO2: 24 mmol/L (ref 20–32)
Calcium: 9.6 mg/dL (ref 8.6–10.3)
Chloride: 105 mmol/L (ref 98–110)
Creat: 0.86 mg/dL (ref 0.60–1.26)
Globulin: 2.1 g/dL (ref 1.9–3.7)
Glucose, Bld: 91 mg/dL (ref 65–99)
Potassium: 4 mmol/L (ref 3.5–5.3)
Sodium: 139 mmol/L (ref 135–146)
Total Bilirubin: 0.6 mg/dL (ref 0.2–1.2)
Total Protein: 7 g/dL (ref 6.1–8.1)

## 2024-09-24 LAB — CBC WITH DIFFERENTIAL/PLATELET
Absolute Lymphocytes: 3451 {cells}/uL (ref 850–3900)
Absolute Monocytes: 588 {cells}/uL (ref 200–950)
Basophils Absolute: 42 {cells}/uL (ref 0–200)
Basophils Relative: 0.6 %
Eosinophils Absolute: 161 {cells}/uL (ref 15–500)
Eosinophils Relative: 2.3 %
HCT: 49.4 % (ref 38.5–50.0)
Hemoglobin: 16.4 g/dL (ref 13.2–17.1)
MCH: 29.5 pg (ref 27.0–33.0)
MCHC: 33.2 g/dL (ref 32.0–36.0)
MCV: 88.8 fL (ref 80.0–100.0)
MPV: 9.5 fL (ref 7.5–12.5)
Monocytes Relative: 8.4 %
Neutro Abs: 2758 {cells}/uL (ref 1500–7800)
Neutrophils Relative %: 39.4 %
Platelets: 267 Thousand/uL (ref 140–400)
RBC: 5.56 Million/uL (ref 4.20–5.80)
RDW: 13.2 % (ref 11.0–15.0)
Total Lymphocyte: 49.3 %
WBC: 7 Thousand/uL (ref 3.8–10.8)

## 2024-09-24 LAB — TSH: TSH: 1.69 m[IU]/L (ref 0.40–4.50)

## 2024-10-04 ENCOUNTER — Telehealth (HOSPITAL_COMMUNITY): Payer: Self-pay

## 2024-10-04 DIAGNOSIS — F908 Attention-deficit hyperactivity disorder, other type: Secondary | ICD-10-CM

## 2024-10-04 MED ORDER — METHYLPHENIDATE HCL ER (LA) 10 MG PO CP24: 10.0000 mg | ORAL_CAPSULE | Freq: Every day | ORAL | 0 refills | Status: DC

## 2024-10-04 MED ORDER — METHYLPHENIDATE HCL 5 MG PO TABS: 5.0000 mg | ORAL_TABLET | Freq: Every day | ORAL | 0 refills | Status: DC

## 2024-10-04 NOTE — Addendum Note (Signed)
 Addended by: CARVIN CROCK on: 10/04/2024 11:55 AM   Modules accepted: Orders

## 2024-10-04 NOTE — Telephone Encounter (Signed)
 PDMP reviewed, medication sent to updated pharmacy.

## 2024-10-04 NOTE — Telephone Encounter (Signed)
 Patients pharmacy does not have enough Ritalin  to fill patients prescription, he is asking that they be resent to [facility] on [address]. Please review and advise, thank yoiu

## 2024-10-05 ENCOUNTER — Ambulatory Visit (HOSPITAL_COMMUNITY): Admitting: Licensed Clinical Social Worker

## 2024-10-19 ENCOUNTER — Encounter (HOSPITAL_COMMUNITY): Payer: Self-pay

## 2024-10-19 ENCOUNTER — Ambulatory Visit (INDEPENDENT_AMBULATORY_CARE_PROVIDER_SITE_OTHER): Admitting: Licensed Clinical Social Worker

## 2024-10-19 DIAGNOSIS — F908 Attention-deficit hyperactivity disorder, other type: Secondary | ICD-10-CM

## 2024-10-19 DIAGNOSIS — F331 Major depressive disorder, recurrent, moderate: Secondary | ICD-10-CM | POA: Diagnosis not present

## 2024-10-19 NOTE — Progress Notes (Unsigned)
 THERAPIST PROGRESS NOTE   Session Date: 10/19/2024  Session Time: 1410 - 1507 Virtual Visit via Video Note  I connected with Rick [name] on 10/19/2024 at  2:00 PM EST by a video enabled telemedicine application and verified that I am speaking with the correct person using two identifiers.  Location: Patient: Home Provider: Home Office   I discussed the limitations of evaluation and management by telemedicine and the availability of in person appointments. The patient expressed understanding and agreed to proceed.  I discussed the assessment and treatment plan with the patient. The patient was provided an opportunity to ask questions and all were answered. The patient agreed with the plan and demonstrated an understanding of the instructions.   The patient was advised to call back or seek an in-person evaluation if the symptoms worsen or if the condition fails to improve as anticipated.  I provided 57 minutes of non-face-to-face time during this encounter.  Participation Level: Active  MSE/Presentation: Behavior: Appropriate and Sharing Speech: Normal Thought Process: Coherent Cognition: Alert and Appropriate Mood: Euthymic Affect: Congruent Insight: Improving Appearance: Casual  Type of Therapy: Individual Therapy  Treatment Goals addressed:   Progressing (5) LTG: Reduce frequency, intensity, and duration of depression symptoms so that daily functioning is improved (OP Depression) LTG: Increase coping skills to manage depression and improve ability to perform daily activities (OP Depression) STG: Rick [name] will reduce frequency of avoidant behaviors by 50% as evidenced by self-report in therapy sessions (OP Depression) LTG: Rick [name] will reduce the amount of anger-related incidents/outbursts by 25% as evidenced by self-report (Anger Management) STG: Rick [name] will identify situations, thoughts, and feelings that trigger internal anger, and/or angry/aggressive actions as evidenced  by self-report (Anger Management)  Progress Towards Goals: Progressing  Interventions: CBT, Motivational Interviewing, Solution Focused, and Supportive  Summary: Rick [name] is a 33 y.o. male with psych history of MDD and ADHD, presenting for follow-up therapy session in efforts to improve management of depressive symptoms.   Patient actively engaged in session, presenting in pleasant moods, and congruent affect throughout duration of visit. 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  Pt shared recounts of Thanksgiving, having visited maternal aunt in [place], reflecting on time spent with cousins and having caught up with maternal half sister, 62yr pt's senior, sharing of last having seen her at Thanksgiving 3 years ago, with occasional contact via phone throughout the years since.    Pt detailed mild stress surrounding continued delays in navigating medical needs of mother, as well as stress surrounding mother's ambivalence related to the sale of a mutually shared home that is tied up in divorce settlement, and considerations for potential to rent property as a means of generating income versus selling.  Engaged in reassessing presenting depressive and anxious symptoms exhibited/observed over the past 2 weeks via PHQ-9 and GAD-7, further processing minimal reduction in depressive symptoms and minor increase in anxious symptoms, attributing such to presenting factors related to mother's medical needs and ongoing divorce proceedings.  Engaged in 87-month review of treatment plan, processing  individual thoughts and perspective surrounding progressions and/or lack thereof, as well as exploring patient's thoughts in regard  to continued areas for work.      10/19/2024    2:39 PM 06/29/2024    2:24 PM 05/26/2024   10:16 AM 05/03/2024    1:34 PM  GAD 7 : Generalized Anxiety Score  Nervous, Anxious, on Edge 1 1 1 1   Control/stop worrying 1 0 0 1  Worry too much - different things 1 1 1 1   Trouble relaxing 1 0 1 0  Restless 1 1 0 0  Easily annoyed or irritable 1 1 1 1   Afraid - awful might happen 0 0 0 0  Total GAD 7 Score 6 4 4 4   Anxiety Difficulty Somewhat difficult Somewhat difficult Somewhat difficult Somewhat difficult      10/19/2024    2:42 PM 09/21/2024    3:14 PM 06/29/2024    2:28 PM 05/26/2024   10:21 AM 05/03/2024    1:24 PM  Depression screen PHQ 2/9  Decreased Interest 2 0 1 1 1   Down, Depressed, Hopeless 1 0 1 1 1   PHQ - 2 Score 3 0 2 2 2   Altered sleeping 1  1 1 1   Tired, decreased energy 0  1 1 1   Change in appetite 1  0 0 1  Feeling bad or failure about yourself  1  1 0 0  Trouble concentrating 0  1 1 0  Moving slowly or fidgety/restless 0  1 0 0  Suicidal thoughts 0  0 0 0  PHQ-9 Score 6  7  5  5    Difficult doing work/chores Somewhat difficult  Somewhat difficult Somewhat difficult Somewhat difficult     Data saved with a previous flowsheet row definition     Suicidal/Homicidal: None, No plan to harm self or others  Therapist Response:  Clinician openly greeted pt upon presenting for today's visit, assessing presenting moods and affect, engaging in introductory check-in. Further utilized open ended questions in evoking recounts of events of recent weeks and exploring presenting challenges. Utilized active listening techniques in providing support and validation of pt's recounts of events. Utilized CBT, MI, and supportive reflections techniques to aid pt in navigating challenges and perspectives related to such.  Clinician reassessed severity of  presenting sxs, and presence of any safety concerns.   [x]  Cognitive Challenging []  Cognitive Refocusing [x]  Cognitive Reframing  [x]  Communication Skills []  Compliance Issues []  DBT [x]  Exploration of Coping Patterns [x]  Exploration of Emotions [x]  Exploration of Relationship Patterns []  Guided Imagery []  Interactive Feedback [x]  Interpersonal Resolutions []  Mindfulness Training []  Preventative Services [x]  Psycho-Education  []  Relaxation/Deep Breathing [x]  Review of Treatment Plan/Progress []  Role-Play/Behavioral Rehearsal  [x]  Structured Problem Solving [x]  Supportive Reflection []  Symptom Management  []  Other   Patient responded well to interventions. Patient continues to meet criteria for MDD and ADHD. Patient will continue to benefit from engagement in outpatient therapy due to being the least restrictive service to meet presenting needs. Pt proves to maintain minimal progression towards goals.  Homework: None.  Plan: Return again in 2 weeks.  Diagnosis:  Encounter Diagnoses  Name Primary?   Moderate episode of recurrent major depressive disorder (HCC) Yes   Attention-deficit hyperactivity disorder, other type     Collaboration of Care: Psychiatrist AEB provider documentation available in EHR.  Patient/Guardian was advised Release of Information must be obtained prior to any record release in order to collaborate their care with an outside provider. Patient/Guardian was advised if they have not already done so to contact the registration department to sign all necessary  forms in order for us  to release information regarding their care.   Consent: Patient/Guardian gives verbal consent for treatment and assignment of benefits for services provided during this visit. Patient/Guardian expressed understanding and agreed to proceed.   Rick [name], MSW, LCSW 10/19/2024,  2:47 PM

## 2024-11-03 ENCOUNTER — Encounter (HOSPITAL_COMMUNITY): Payer: Self-pay

## 2024-11-23 ENCOUNTER — Ambulatory Visit (INDEPENDENT_AMBULATORY_CARE_PROVIDER_SITE_OTHER): Admitting: Licensed Clinical Social Worker

## 2024-11-23 DIAGNOSIS — F908 Attention-deficit hyperactivity disorder, other type: Secondary | ICD-10-CM

## 2024-11-23 DIAGNOSIS — F330 Major depressive disorder, recurrent, mild: Secondary | ICD-10-CM

## 2024-11-23 NOTE — Progress Notes (Signed)
 THERAPIST PROGRESS NOTE   Session Date: 11/23/2024  Session Time: 9080 - 1007 Virtual Visit via Video Note  I connected with Rick [name] on 11/23/24 at  9:19 AM EST by a video enabled telemedicine application and verified that I am speaking with the correct person using two identifiers.  Location: Patient: Home Provider: Home Office   I discussed the limitations of evaluation and management by telemedicine and the availability of in person appointments. The patient expressed understanding and agreed to proceed.  I discussed the assessment and treatment plan with the patient. The patient was provided an opportunity to ask questions and all were answered. The patient agreed with the plan and demonstrated an understanding of the instructions.   The patient was advised to call back or seek an in-person evaluation if the symptoms worsen or if the condition fails to improve as anticipated.  I provided 48 minutes of non-face-to-face time during this encounter.  Participation Level: Active  MSE/Presentation: Behavior: Appropriate and Sharing Speech: Normal Thought Process: Coherent Cognition: Alert and Appropriate Mood: Depressed Affect: Congruent Insight: Improving Appearance: Casual  Type of Therapy: Individual Therapy  Treatment Goals addressed:   Progressing (5) LTG: Reduce frequency, intensity, and duration of depression symptoms so that daily functioning is improved (OP Depression) LTG: Increase coping skills to manage depression and improve ability to perform daily activities (OP Depression) STG: [place] will reduce frequency of avoidant behaviors by 50% as evidenced by self-report in therapy sessions (OP Depression) LTG: Ollie will reduce the amount of anger-related incidents/outbursts by 25% as evidenced by self-report (Anger Management) STG: [place]ion will identify situations, thoughts, and feelings that trigger internal anger, and/or angry/aggressive actions as evidenced by  self-report (Anger Management)  Progress Towards Goals: Progressing  Interventions: CBT, Motivational Interviewing, Solution Focused, and Supportive  Summary: Rick [name] is a 34 y.o. male with psych history of MDD and ADHD, presenting for follow-up therapy session in efforts to improve management of depressive symptoms.   Pt actively engaged in session, presenting in depressed, yet pleasant moods, and congruent affect throughout duration of visit.   Pt engaged in reassessing presenting depressive and anxious sxs via PHQ-9 and GAD-7, exploring increase in scores and contributing factors.  Pt shared that mood has been low since the start of the year, noting reduced motivation and spending the majority of time inside the home, which pt identified as negatively impacting overall mood and energy. Pt reported increased stress related to upcoming travel to [place] for mothers court-ordered psychiatric evaluation, in addition to ongoing emotional strain surrounding separation and divorce. Further processed anxiety patterns, including overthinking, rumination at night leading to disrupted sleep and fatigue, and interaction of anxiousness with ADHD symptoms resulting in fixation on stressors and negative thoughts. Reviewed holiday experiences, noting low motivation and delayed activities within the home, attributing these challenges in part to mothers depressed moods and brothers isolation.  Processed benefits of seeking out events/activities pt can engage in individually as a means of self-care, behavioral activation efforts, in aims of improving moods.      11/23/2024    9:24 AM 10/19/2024    2:39 PM 06/29/2024    2:24 PM 05/26/2024   10:16 AM  GAD 7 : Generalized Anxiety Score  Nervous, Anxious, on Edge 1 1  1  1    Control/stop worrying 1 1  0  0   Worry too much - different things 1 1  1  1    Trouble relaxing 1 1  0  1  Restless 1 1  1   0   Easily annoyed or irritable 2 1  1  1    Afraid -  awful might happen 1 0  0  0   Total GAD 7 Score 8 6 4 4   Anxiety Difficulty Very difficult Somewhat difficult Somewhat difficult Somewhat difficult     Data saved with a previous flowsheet row definition      11/23/2024    9:28 AM 10/19/2024    2:42 PM 09/21/2024    3:14 PM 06/29/2024    2:28 PM 05/26/2024   10:21 AM  Depression screen PHQ 2/9  Decreased Interest 2 2 0 1 1  Down, Depressed, Hopeless 2 1 0 1 1  PHQ - 2 Score 4 3 0 2 2  Altered sleeping 2 1  1 1   Tired, decreased energy 1 0  1 1  Change in appetite 1 1  0 0  Feeling bad or failure about yourself  1 1  1  0  Trouble concentrating 1 0  1 1  Moving slowly or fidgety/restless 1 0  1 0  Suicidal thoughts 0 0  0 0  PHQ-9 Score 11 6  7  5    Difficult doing work/chores Somewhat difficult Somewhat difficult  Somewhat difficult Somewhat difficult     Data saved with a previous flowsheet row definition     Suicidal/Homicidal: None, No plan to harm self or others  Therapist Response:  Clinician openly greeted pt upon presenting for today's visit, assessing presenting moods and affect, engaging in introductory check-in. Utilized open ended questions in evoking recounts of events of recent weeks and exploring presenting stressors. Utilized active listening in providing support and validation of pt's recounts of events. Utilized CBT-based cognitive reframing to address rumination and negative thinking patterns, MI strategies to enhance motivation toward behavioral activation, and strengths-based and supportive reflection to validate pts insight and resilience. Therapist reinforced small, achievable steps toward increasing outside activity and expanding pts environment to improve mood stability. Clinician reassessed severity of presenting sxs, and presence of any safety concerns.   [x]  Cognitive Challenging []  Cognitive Refocusing [x]  Cognitive Reframing  []  Communication Skills []  Compliance Issues []  DBT [x]  Exploration of  Coping Patterns [x]  Exploration of Emotions [x]  Exploration of Relationship Patterns []  Guided Imagery []  Interactive Feedback [x]  Interpersonal Resolutions []  Mindfulness Training []  Preventative Services [x]  Psycho-Education  []  Relaxation/Deep Breathing []  Review of Treatment Plan/Progress []  Role-Play/Behavioral Rehearsal  [x]  Structured Problem Solving [x]  Supportive Reflection [x]  Symptom Management  []  Other   Patient responded well to interventions. Patient continues to meet criteria for MDD and ADHD. Patient will continue to benefit from engagement in outpatient therapy due to being the least restrictive service to meet presenting needs. Pt proves to maintain minimal progression towards goals.  Homework: Actively begin seeking out things to participate in for means of self care and things pt enjoys himself to support improvements in moods.  Plan: Return again in 2 weeks. Begin exploration of thoughts stopping/challenging techniques and interventions to aid in mgmt of anxiousness and increased instances of over-thinking.  Diagnosis:  Encounter Diagnoses  Name Primary?   Attention-deficit hyperactivity disorder, other type Yes   MDD (major depressive disorder), recurrent episode, mild     Collaboration of Care: Psychiatrist AEB provider documentation available in EHR.  Patient/Guardian was advised Release of Information must be obtained prior to any record release in order to collaborate their care with an outside provider. Patient/Guardian was advised if they have not  already done so to contact the registration department to sign all necessary forms in order for us  to release information regarding their care.   Consent: Patient/Guardian gives verbal consent for treatment and assignment of benefits for services provided during this visit. Patient/Guardian expressed understanding and agreed to proceed.   Lynwood JONETTA Maris, MSW, LCSW 11/23/2024,  9:22 AM

## 2024-11-29 NOTE — Progress Notes (Unsigned)
 BH MD/PA/NP OP Progress Note  12/02/2024 11:19 AM [name]  MRN:  [id number]  Visit Diagnosis:    ICD-10-CM   1. Encounter for long-term (current) use of medications  Z79.899 ToxASSURE Select 13 (MW), Urine    2. Attention-deficit hyperactivity disorder, other type  F90.8 methylphenidate  (RITALIN  LA) 10 MG 24 hr capsule    methylphenidate  (RITALIN  LA) 10 MG 24 hr capsule    methylphenidate  (RITALIN  LA) 10 MG 24 hr capsule    methylphenidate  (RITALIN ) 5 MG tablet    methylphenidate  (RITALIN ) 5 MG tablet    buPROPion  (WELLBUTRIN  XL) 300 MG 24 hr tablet    buPROPion  (WELLBUTRIN  XL) 150 MG 24 hr tablet    3. Moderate episode of recurrent major depressive disorder (HCC)  F33.1 buPROPion  (WELLBUTRIN  XL) 300 MG 24 hr tablet    buPROPion  (WELLBUTRIN  XL) 150 MG 24 hr tablet      Assessment: Rick [name] is a 34 y.o. male with a history of  MDD and ADHD who presented to [facility] at [facility] for initial evaluation on 10/17/2023.  At initial evaluation patient reported a history of depression that has been well controlled.  Patient reported symptoms began around 2009 at which time he endorsed low mood, increased isolation, negative self thoughts, feelings of worthlessness, and passive SI.  He denied ever experiencing any active SI.  Patient also was diagnosed with ADD at that time reporting having completed neuropsych testing and experiencing poor attention and concentration.  This has also been stable on medication for a number of years prior to initial evaluation.  Patient met criteria for MDD currently in remission and ADHD on initial evaluation.  Rick [name] presents for follow-up evaluation. Today, 12/02/24, patient has had stable mood and ADHD symptoms.  He is taking medications consistently and denies adverse side effects.  Will continue on current regimen and follow-up in 3 months.  Patient will get UTOX in the interim.  Plan: - Continue Wellbutrin  xl  450 mg daily - Continue Ritalin  LA 10 mg daily  - Continue Ritalin  IR 5 mg Q12-2pm daily - CBC, CMP, lipid profile, vitamin D , TSH, and U-Tox reviewed  -Repeat UTOX ordered - Neuropsych testing, reviewed and confirmed ADHD diagnosis - Continue therapy with Rick [name] - Crisis resources reviewed - Follow up in 3 months  Chief Complaint:  Chief Complaint  Patient presents with   Follow-up   HPI: Rick [name] presents reporting that things are about the same over the past few months.  His mood symptoms remain stable and unchanged and ADHD symptoms well-controlled.  Patient has been focusing on caring for his mother in the house still.  There is still ongoing difficulty with the windows that were further delayed by recent weather.  Patient notes that he will be taking his mother up to [place]  in the near future to meet with her lawyer.  He is taking medications consistently compared to the past.  Patient take the Ritalin  at least 5 times a week.  He is taking bupropion  consistently and denies adverse side effects from either.  Past Psychiatric History: Had seen a psychiatrist in 2009 who dx him with ADD and depression. His PCP have managed his medications since. He saw them for a few visits. He then saw a therapist for 3 years followed by one off and on for 5 years throughout college.  Patient denies any prior psychiatric hospitalizations or past suicide attempts.  Patient has taken Ritalin  and Xanax in the past for  sleep  Has a drink every 1-2 weeks. Tried marijuana in college gave him anxiety. Occasional coffee.    Past Medical History:  Past Medical History:  Diagnosis Date   ADHD (attention deficit hyperactivity disorder)    Depression    Keratoconus    Tinnitus of right ear     Past Surgical History:  Procedure Laterality Date   EYE SURGERY Bilateral 11/10/2023   HERNIA REPAIR     SKIN TAG REMOVAL  08/2024   WISDOM TOOTH EXTRACTION      Family History:  Family History  Problem  Relation Age of Onset   Stroke Mother    Cancer Maternal Grandmother    Heart attack Maternal Grandfather    Epilepsy Maternal Grandfather     Social History:  Social History   Socioeconomic History   Marital status: Single    Spouse name: Not on file   Number of children: Not on file   Years of education: Not on file   Highest education level: Bachelor's degree (e.g., BA, AB, BS)  Occupational History   Not on file  Tobacco Use   Smoking status: Never   Smokeless tobacco: Never  Vaping Use   Vaping status: Never Used  Substance and Sexual Activity   Alcohol use: Yes   Drug use: Not Currently    Types: Marijuana   Sexual activity: Not on file  Other Topics Concern   Not on file  Social History Narrative   Not on file   Social Drivers of Health   Tobacco Use: Low Risk (12/02/2024)   Patient History    Smoking Tobacco Use: Never    Smokeless Tobacco Use: Never    Passive Exposure: Not on file  Financial Resource Strain: Low Risk (03/17/2024)   Overall Financial Resource Strain (CARDIA)    Difficulty of Paying Living Expenses: Not very hard  Food Insecurity: No Food Insecurity (03/17/2024)   Hunger Vital Sign    Worried About Running Out of Food in the Last Year: Never true    Ran Out of Food in the Last Year: Never true  Transportation Needs: No Transportation Needs (03/17/2024)   PRAPARE - Administrator, Civil Service (Medical): No    Lack of Transportation (Non-Medical): No  Physical Activity: Insufficiently Active (03/17/2024)   Exercise Vital Sign    Days of Exercise per Week: 1 day    Minutes of Exercise per Session: 20 min  Stress: Stress Concern Present (03/17/2024)   [employer] of Occupational Health - Occupational Stress Questionnaire    Feeling of Stress : To some extent  Social Connections: Socially Isolated (03/17/2024)   Social Connection and Isolation Panel    Frequency of Communication with Friends and Family: Never    Frequency of  Social Gatherings with Friends and Family: Never    Attends Religious Services: 1 to 4 times per year    Active Member of Clubs or Organizations: No    Attends Banker Meetings: Not on file    Marital Status: Never married  Depression (PHQ2-9): High Risk (11/23/2024)   Depression (PHQ2-9)    PHQ-2 Score: 11  Alcohol Screen: Low Risk (03/17/2024)   Alcohol Screen    Last Alcohol Screening Score (AUDIT): 1  Housing: Low Risk (03/17/2024)   Housing Stability Vital Sign    Unable to Pay for Housing in the Last Year: No    Number of Times Moved in the Last Year: 0    Homeless in the  Last Year: No  Utilities: Not on file  Health Literacy: Not on file    Allergies: No Known Allergies  Current Medications: Current Outpatient Medications  Medication Sig Dispense Refill   [START ON 12/31/2024] buPROPion  (WELLBUTRIN  XL) 150 MG 24 hr tablet Take 1 tablet (150 mg total) by mouth daily. Take with 300 mg tab for a total of 450 mg 90 tablet 0   [START ON 12/31/2024] buPROPion  (WELLBUTRIN  XL) 300 MG 24 hr tablet Take 1 tablet (300 mg total) by mouth every morning. 90 tablet 0   hydrocortisone  2.5 % cream Apply daily to affected areas for 2 weeks then decrease to 3x week. (Patient taking differently: as needed. Apply daily to affected areas for 2 weeks then decrease to 3x week.) 28 g 2   [START ON 12/06/2024] methylphenidate  (RITALIN  LA) 10 MG 24 hr capsule Take 1 capsule (10 mg total) by mouth daily. 30 capsule 0   [START ON 01/04/2025] methylphenidate  (RITALIN  LA) 10 MG 24 hr capsule Take 1 capsule (10 mg total) by mouth daily. 30 capsule 0   [START ON 02/04/2025] methylphenidate  (RITALIN  LA) 10 MG 24 hr capsule Take 1 capsule (10 mg total) by mouth daily. 30 capsule 0   methylphenidate  (RITALIN ) 5 MG tablet Take 1 tablet (5 mg total) by mouth daily as needed. Take between noon and 2 pm 20 tablet 0   [START ON 12/06/2024] methylphenidate  (RITALIN ) 5 MG tablet Take 1 tablet (5 mg total) by mouth  daily. Take between noon and 2 PM 30 tablet 0   [START ON 01/04/2025] methylphenidate  (RITALIN ) 5 MG tablet Take 1 tablet (5 mg total) by mouth daily. Take between noon and 2 PM 30 tablet 0   No current facility-administered medications for this visit.     Psychiatric Specialty Exam: Review of Systems  There were no vitals taken for this visit.There is no height or weight on file to calculate BMI.  General Appearance: Well Groomed  Eye Contact:  Good  Speech:  Clear and Coherent  Volume:  Normal  Mood:  Euthymic  Affect:  Appropriate  Thought Process:  Coherent  Orientation:  Full (Time, Place, and Person)  Thought Content: Logical   Suicidal Thoughts:  No  Homicidal Thoughts:  No  Memory:  Immediate;   Good  Judgement:  Good  Insight:  Fair  Psychomotor Activity:  Normal  Concentration:  Concentration: Good  Recall:  Good  Fund of Knowledge: Good  Language: Good  Akathisia:  NA    AIMS (if indicated): not done  Assets:  Communication Skills Desire for Improvement Financial Resources/Insurance Housing Talents/Skills Transportation  ADL's:  Intact  Cognition: WNL  Sleep:  Good   Metabolic Disorder Labs: No results found for: HGBA1C, MPG No results found for: PROLACTIN Lab Results  Component Value Date   CHOL 183 03/19/2024   TRIG 88 03/19/2024   HDL 41 03/19/2024   CHOLHDL 4.5 03/19/2024   LDLCALC 123 (H) 03/19/2024   LDLCALC 116 (H) 09/22/2023   Lab Results  Component Value Date   TSH 1.69 09/23/2024   TSH 1.14 03/19/2024    Therapeutic Level Labs: No results found for: LITHIUM No results found for: VALPROATE No results found for: CBMZ   Screenings: GAD-7    Advertising Copywriter from 11/23/2024 in [facility] at [facility] from 10/19/2024 in [facility] at [facility] from 06/29/2024 in [facility] at [facility] Video Visit from  05/26/2024  in [facility] at [facility] from 05/03/2024 in [facility] at [facility]  Total GAD-7 Score 8 6 4 4 4    PHQ2-9    Flowsheet Row Counselor from 11/23/2024 in [facility] at [facility] from 10/19/2024 in [facility] at [facility] Visit from 09/21/2024 in [facility] Counselor from 06/29/2024 in [facility] at [facility] Video Visit from 05/26/2024 in [facility] at [facility] Total Score 4 3 0 2 2  PHQ-9 Total Score 11 6 -- 7 5   Flowsheet Row Office Visit from 10[facility]17/2023 [facility]  C-SSRS RISK CATEGORY No Risk    Collaboration of Care: Collaboration of Care: Medication Management AEB medication prescription and Referral or follow-up with counselor/therapist AEB chart review  Patient/Guardian was advised Release of Information must be obtained prior to any record release in order to collaborate their care with an outside provider. Patient/Guardian was advised if they have not already done so to contact the registration department to sign all necessary forms in order for us  to release information regarding their care.   Consent: Patient/Guardian gives verbal consent for treatment and assignment of benefits for services provided during this visit. Patient/Guardian expressed understanding and agreed to proceed.    Arvella CHRISTELLA Finder, MD 12/02/2024, 11:19 AM   Virtual Visit via Video Note  I connected with Rick [name] on 12/02/24 at 11:00 AM EST by a video enabled telemedicine application and verified that I am speaking with the correct person using two identifiers.  Location: Patient: Home Provider: Home Office   I discussed the limitations of evaluation and management by telemedicine and the  availability of in person appointments. The patient expressed understanding and agreed to proceed.   I discussed the assessment and treatment plan with the patient. The patient was provided an opportunity to ask questions and all were answered. The patient agreed with the plan and demonstrated an understanding of the instructions.   The patient was advised to call back or seek an in-person evaluation if the symptoms worsen or if the condition fails to improve as anticipated.  I provided 15 minutes of non-face-to-face time during this encounter.   Arvella CHRISTELLA Finder, MD

## 2024-12-02 ENCOUNTER — Telehealth (HOSPITAL_COMMUNITY): Admitting: Psychiatry

## 2024-12-02 ENCOUNTER — Encounter (HOSPITAL_COMMUNITY): Payer: Self-pay | Admitting: Psychiatry

## 2024-12-02 DIAGNOSIS — F908 Attention-deficit hyperactivity disorder, other type: Secondary | ICD-10-CM

## 2024-12-02 DIAGNOSIS — F331 Major depressive disorder, recurrent, moderate: Secondary | ICD-10-CM | POA: Diagnosis not present

## 2024-12-02 DIAGNOSIS — Z79899 Other long term (current) drug therapy: Secondary | ICD-10-CM

## 2024-12-02 MED ORDER — METHYLPHENIDATE HCL 5 MG PO TABS: 5.0000 mg | ORAL_TABLET | Freq: Every day | ORAL | 0 refills | Status: AC

## 2024-12-02 MED ORDER — METHYLPHENIDATE HCL ER (LA) 10 MG PO CP24: 10.0000 mg | ORAL_CAPSULE | Freq: Every day | ORAL | 0 refills | Status: AC

## 2024-12-02 MED ORDER — BUPROPION HCL ER (XL) 300 MG PO TB24: 300.0000 mg | ORAL_TABLET | ORAL | 0 refills | Status: AC

## 2024-12-02 MED ORDER — BUPROPION HCL ER (XL) 150 MG PO TB24: 150.0000 mg | ORAL_TABLET | Freq: Every day | ORAL | 0 refills | Status: AC

## 2024-12-07 ENCOUNTER — Ambulatory Visit (HOSPITAL_COMMUNITY): Admitting: Licensed Clinical Social Worker

## 2024-12-07 DIAGNOSIS — F330 Major depressive disorder, recurrent, mild: Secondary | ICD-10-CM

## 2024-12-07 DIAGNOSIS — F908 Attention-deficit hyperactivity disorder, other type: Secondary | ICD-10-CM

## 2024-12-21 ENCOUNTER — Ambulatory Visit (HOSPITAL_COMMUNITY): Admitting: Licensed Clinical Social Worker

## 2025-01-04 ENCOUNTER — Ambulatory Visit (HOSPITAL_COMMUNITY): Admitting: Licensed Clinical Social Worker

## 2025-01-18 ENCOUNTER — Ambulatory Visit (HOSPITAL_COMMUNITY): Admitting: Licensed Clinical Social Worker

## 2025-02-01 ENCOUNTER — Ambulatory Visit (HOSPITAL_COMMUNITY): Admitting: Licensed Clinical Social Worker

## 2025-03-01 ENCOUNTER — Telehealth (HOSPITAL_COMMUNITY): Admitting: Psychiatry

## 2025-08-15 ENCOUNTER — Ambulatory Visit: Admitting: Physician Assistant

## 2025-09-22 ENCOUNTER — Encounter: Admitting: Family
